# Patient Record
Sex: Female | Born: 1985 | Race: White | Hispanic: No | Marital: Single | Smoking: Never smoker
Health system: Southern US, Community
[De-identification: ages and names within clinical notes are randomized; demographics above are authoritative.]

## PROBLEM LIST (undated history)

## (undated) ENCOUNTER — Emergency Department (HOSPITAL_COMMUNITY): Admission: EM | Payer: Self-pay | Source: Home / Self Care

## (undated) DIAGNOSIS — IMO0002 Reserved for concepts with insufficient information to code with codable children: Secondary | ICD-10-CM

## (undated) DIAGNOSIS — M329 Systemic lupus erythematosus, unspecified: Secondary | ICD-10-CM

---

## 2018-08-26 ENCOUNTER — Ambulatory Visit: Payer: Self-pay | Admitting: *Deleted

## 2018-08-26 NOTE — Telephone Encounter (Signed)
Pt called In c/o fever 102, congestion and shortness of breath.   She is going to the [facility] because that is close to her.   I instructed her to call them first and let them know her symptoms and that she is coming there so they will be prepared for her.   She verbalized understanding of these instructions and agreed to that plan.    Reason for Disposition . MODERATE difficulty breathing (e.g., speaks in phrases, SOB even at rest, pulse 100-120)  Answer Assessment - Initial Assessment Questions 1. COVID-19 DIAGNOSIS: "Who made your Coronavirus (COVID-19) diagnosis?" "Was it confirmed by a positive lab test?" If not diagnosed by a HCP, ask "Are there lots of cases (community spread) where you live?" (See public health department website, if unsure)     No exposures to COVID-19 2. ONSET: "When did the COVID-19 symptoms start?"      I woke up this morning with fever, short of breath.  102 fever. 3. WORST SYMPTOM: "What is your worst symptom?" (e.g., cough, fever, shortness of breath, muscle aches)     Fever 102.    Dry cough.   Muscles aches and chills 4. COUGH: "Do you have a cough?" If so, ask: "How bad is the cough?"       Dry cough 5. FEVER: "Do you have a fever?" If so, ask: "What is your temperature, how was it measured, and when did it start?"     102 6. RESPIRATORY STATUS: "Describe your breathing?" (e.g., shortness of breath, wheezing, unable to speak)      Short of breath.  No asthma.   I usually run every morning.   I can only do 1/2 a breath. 7. BETTER-SAME-WORSE: "Are you getting better, staying the same or getting worse compared to yesterday?"  If getting worse, ask, "In what way?"     Worse 8. HIGH RISK DISEASE: "Do you have any chronic medical problems?" (e.g., asthma, heart or lung disease, weak immune system, etc.)     No asthma or heart problems 9. PREGNANCY: "Is there any chance you are pregnant?" "When was your last menstrual period?"     No 10. OTHER SYMPTOMS:  "Do you have any other symptoms?"  (e.g., chills, fatigue, headache, loss of smell or taste, muscle pain, sore throat)       I have a headache for a week, my taste has changed.  Protocols used: CORONAVIRUS (COVID-19) DIAGNOSED OR SUSPECTED-A-AH

## 2018-09-12 ENCOUNTER — Encounter (HOSPITAL_COMMUNITY): Payer: Self-pay

## 2018-09-12 ENCOUNTER — Emergency Department (HOSPITAL_COMMUNITY): Payer: BLUE CROSS/BLUE SHIELD

## 2018-09-12 ENCOUNTER — Emergency Department (HOSPITAL_COMMUNITY)
Admission: EM | Admit: 2018-09-12 | Discharge: 2018-09-13 | Disposition: A | Discharge status: Home / Self Care | Payer: BLUE CROSS/BLUE SHIELD | Attending: Emergency Medicine | Admitting: Emergency Medicine

## 2018-09-12 DIAGNOSIS — Y9301 Activity, walking, marching and hiking: Secondary | ICD-10-CM | POA: Insufficient documentation

## 2018-09-12 DIAGNOSIS — Y92018 Other place in single-family (private) house as the place of occurrence of the external cause: Secondary | ICD-10-CM | POA: Insufficient documentation

## 2018-09-12 DIAGNOSIS — Y908 Blood alcohol level of 240 mg/100 ml or more: Secondary | ICD-10-CM | POA: Diagnosis not present

## 2018-09-12 DIAGNOSIS — W19XXXA Unspecified fall, initial encounter: Secondary | ICD-10-CM

## 2018-09-12 DIAGNOSIS — F10929 Alcohol use, unspecified with intoxication, unspecified: Secondary | ICD-10-CM | POA: Diagnosis not present

## 2018-09-12 DIAGNOSIS — F10920 Alcohol use, unspecified with intoxication, uncomplicated: Secondary | ICD-10-CM

## 2018-09-12 DIAGNOSIS — Y999 Unspecified external cause status: Secondary | ICD-10-CM | POA: Diagnosis not present

## 2018-09-12 DIAGNOSIS — M791 Myalgia, unspecified site: Secondary | ICD-10-CM | POA: Diagnosis present

## 2018-09-12 DIAGNOSIS — W108XXA Fall (on) (from) other stairs and steps, initial encounter: Secondary | ICD-10-CM | POA: Insufficient documentation

## 2018-09-12 HISTORY — DX: Reserved for concepts with insufficient information to code with codable children: IMO0002

## 2018-09-12 HISTORY — DX: Systemic lupus erythematosus, unspecified: M32.9

## 2018-09-12 LAB — I-STAT CHEM 8, ED
BUN: 9 mg/dL (ref 6–20)
Calcium, Ion: 0.89 mmol/L — CL (ref 1.15–1.40)
Chloride: 105 mmol/L (ref 98–111)
Creatinine, Ser: 1.2 mg/dL — ABNORMAL HIGH (ref 0.44–1.00)
Glucose, Bld: 116 mg/dL — ABNORMAL HIGH (ref 70–99)
HCT: 42 % (ref 36.0–46.0)
Hemoglobin: 14.3 g/dL (ref 12.0–15.0)
Potassium: 4 mmol/L (ref 3.5–5.1)
Sodium: 136 mmol/L (ref 135–145)
TCO2: 23 mmol/L (ref 22–32)

## 2018-09-12 LAB — I-STAT BETA HCG BLOOD, ED (MC, WL, AP ONLY): I-stat hCG, quantitative: <5 m[IU]/mL (ref <5)

## 2018-09-12 LAB — SAMPLE TO BLOOD BANK

## 2018-09-12 LAB — CBC
HCT: 38.3 % (ref 36.0–46.0)
Hemoglobin: 13.2 g/dL (ref 12.0–15.0)
MCH: 36.5 pg — ABNORMAL HIGH (ref 26.0–34.0)
MCHC: 34.5 g/dL (ref 30.0–36.0)
MCV: 105.8 fL — ABNORMAL HIGH (ref 80.0–100.0)
Platelets: 136 10*3/uL — ABNORMAL LOW (ref 150–400)
RBC: 3.62 MIL/uL — ABNORMAL LOW (ref 3.87–5.11)
RDW: 15.3 % (ref 11.5–15.5)
WBC: 7.9 10*3/uL (ref 4.0–10.5)
nRBC: 0 % (ref 0.0–0.2)

## 2018-09-12 LAB — PROTIME-INR
INR: 1 (ref 0.8–1.2)
Prothrombin Time: 13.4 seconds (ref 11.4–15.2)

## 2018-09-12 LAB — LACTIC ACID, PLASMA: Lactic Acid, Venous: 3.3 mmol/L (ref 0.5–1.9)

## 2018-09-12 NOTE — ED Triage Notes (Signed)
Pt from home with ems for fall down 12 stairs, pt had 4 beers and 2 shots of liquor PTA. Pt alert and oriented to self only. Pt c.o pain all over, very anxious. GCS 14 VSS

## 2018-09-13 ENCOUNTER — Encounter (HOSPITAL_COMMUNITY): Payer: Self-pay | Admitting: Emergency Medicine

## 2018-09-13 LAB — URINALYSIS, ROUTINE W REFLEX MICROSCOPIC
Bilirubin Urine: NEGATIVE
Glucose, UA: NEGATIVE mg/dL
Hgb urine dipstick: NEGATIVE
Ketones, ur: NEGATIVE mg/dL
Leukocytes,Ua: NEGATIVE
Nitrite: NEGATIVE
Protein, ur: NEGATIVE mg/dL
Specific Gravity, Urine: 1.003 — ABNORMAL LOW (ref 1.005–1.030)
pH: 6 (ref 5.0–8.0)

## 2018-09-13 LAB — COMPREHENSIVE METABOLIC PANEL
ALT: 152 U/L — ABNORMAL HIGH (ref 0–44)
AST: 395 U/L — ABNORMAL HIGH (ref 15–41)
Albumin: 4.2 g/dL (ref 3.5–5.0)
Alkaline Phosphatase: 175 U/L — ABNORMAL HIGH (ref 38–126)
Anion gap: 18 — ABNORMAL HIGH (ref 5–15)
BUN: 7 mg/dL (ref 6–20)
CO2: 19 mmol/L — ABNORMAL LOW (ref 22–32)
Calcium: 9.4 mg/dL (ref 8.9–10.3)
Chloride: 102 mmol/L (ref 98–111)
Creatinine, Ser: 0.64 mg/dL (ref 0.44–1.00)
GFR calc Af Amer: >60 mL/min (ref >60)
GFR calc non Af Amer: >60 mL/min (ref >60)
Glucose, Bld: 115 mg/dL — ABNORMAL HIGH (ref 70–99)
Potassium: 3.7 mmol/L (ref 3.5–5.1)
Sodium: 139 mmol/L (ref 135–145)
Total Bilirubin: 0.7 mg/dL (ref 0.3–1.2)
Total Protein: 7.2 g/dL (ref 6.5–8.1)

## 2018-09-13 LAB — CDS SEROLOGY

## 2018-09-13 LAB — ETHANOL: Alcohol, Ethyl (B): 474 mg/dL (ref <10)

## 2018-09-13 MED ORDER — IOHEXOL 300 MG/ML  SOLN
100.0000 mL | Freq: Once | INTRAMUSCULAR | Status: AC | PRN
  Administered 2018-09-13: 100 mL via INTRAVENOUS

## 2018-09-13 MED ORDER — SODIUM CHLORIDE 0.9 % IV BOLUS
1000.0000 mL | Freq: Once | INTRAVENOUS | Status: AC
  Administered 2018-09-13: 04:00:00 1000 mL via INTRAVENOUS

## 2018-09-13 MED ORDER — IBUPROFEN 800 MG PO TABS: 800.0000 mg | ORAL_TABLET | Freq: Three times a day (TID) | ORAL | 0 refills | Status: DC

## 2018-09-13 MED ORDER — ACETAMINOPHEN 500 MG PO TABS
1000.0000 mg | ORAL_TABLET | Freq: Once | ORAL | Status: AC
  Administered 2018-09-13: 1000 mg via ORAL
  Filled 2018-09-13: qty 2

## 2018-09-13 MED ORDER — IBUPROFEN 800 MG PO TABS
800.0000 mg | ORAL_TABLET | Freq: Once | ORAL | Status: AC
  Administered 2018-09-13: 07:00:00 800 mg via ORAL
  Filled 2018-09-13: qty 1

## 2018-09-13 NOTE — ED Notes (Signed)
Pt ambulatory to the bathroom with standby assist. No complaints at this time. Pt still confused, thinks she is in [place].

## 2018-09-13 NOTE — ED Notes (Signed)
Dr, Randal Buba aware of current VS, orders for 1 liter NS given.

## 2018-09-13 NOTE — ED Notes (Signed)
Patient  Has pulled all sheets off the stretcher , new sheets applied warm blankets given.

## 2018-09-13 NOTE — ED Provider Notes (Signed)
[facility]   CSN: [id number] Arrival date & time: 09/12/18  2309     History   Chief Complaint Chief Complaint  Patient presents with   Fall    HPI Alison HammingLindsay Morgan Koch is a 33 y.o. female.     The history is provided by the patient and the EMS personnel.  Fall This is a new problem. The current episode started less than 1 hour ago. The problem occurs constantly. The problem has been resolved. Pertinent negatives include no chest pain, no abdominal pain, no headaches and no shortness of breath. Nothing aggravates the symptoms. Nothing relieves the symptoms. She has tried nothing for the symptoms. The treatment provided no relief.    Past Medical History:  Diagnosis Date   Lupus (HCC)     There are no active problems to display for this patient.   History reviewed. No pertinent surgical history.   OB History   No obstetric history on file.      Home Medications    Prior to Admission medications   Not on File    Family History No family history on file.  Social History Social History   Tobacco Use   Smoking status: Not on file  Substance Use Topics   Alcohol use: Not on file   Drug use: Not on file     Allergies   Buspar [buspirone] and Vancomycin   Review of Systems Review of Systems  Constitutional: Negative for fever.  Eyes: Negative for visual disturbance.  Respiratory: Negative for shortness of breath.   Cardiovascular: Negative for chest pain.  Gastrointestinal: Negative for abdominal pain.  Neurological: Negative for headaches.  All other systems reviewed and are negative.    Physical Exam Updated Vital Signs BP 107/73    Pulse 100    Temp (!) 97 F (36.1 C) (Temporal)    Resp 20    Ht 5\' 4"  (1.626 m)    Wt 64.4 kg    SpO2 93%    BMI 24.37 kg/m   Physical Exam Vitals signs and nursing note reviewed.  Constitutional:      General: She is not in acute distress.  Appearance: She is normal weight.  HENT:     Head: Normocephalic and atraumatic.     Right Ear: Tympanic membrane normal.     Left Ear: Tympanic membrane normal.     Nose: Nose normal.     Mouth/Throat:     Mouth: Mucous membranes are moist.     Pharynx: Oropharynx is clear.  Eyes:     Conjunctiva/sclera: Conjunctivae normal.     Pupils: Pupils are equal, round, and reactive to light.  Cardiovascular:     Rate and Rhythm: Normal rate and regular rhythm.     Pulses: Normal pulses.     Heart sounds: Normal heart sounds.  Pulmonary:     Effort: Pulmonary effort is normal.     Breath sounds: Normal breath sounds.  Abdominal:     General: Abdomen is flat. Bowel sounds are normal.     Tenderness: There is no abdominal tenderness. There is no guarding.     Hernia: No hernia is present.  Musculoskeletal: Normal range of motion.  Lymphadenopathy:     Cervical: No cervical adenopathy.  Skin:    General: Skin is warm and dry.     Capillary Refill: Capillary refill takes less than 2 seconds.  Neurological:     General: No focal deficit present.  Mental Status: She is alert.     Deep Tendon Reflexes: Reflexes normal.  Psychiatric:        Mood and Affect: Mood normal.        Behavior: Behavior normal.      ED Treatments / Results  Labs (all labs ordered are listed, but only abnormal results are displayed) Labs Reviewed  COMPREHENSIVE METABOLIC PANEL - Abnormal; Notable for the following components:      Result Value   CO2 19 (*)    Glucose, Bld 115 (*)    AST 395 (*)    ALT 152 (*)    Alkaline Phosphatase 175 (*)    Anion gap 18 (*)    All other components within normal limits  CBC - Abnormal; Notable for the following components:   RBC 3.62 (*)    MCV 105.8 (*)    MCH 36.5 (*)    Platelets 136 (*)    All other components within normal limits  ETHANOL - Abnormal; Notable for the following components:   Alcohol, Ethyl (B) 474 (*)    All other components within normal  limits  LACTIC ACID, PLASMA - Abnormal; Notable for the following components:   Lactic Acid, Venous 3.3 (*)    All other components within normal limits  I-STAT CHEM 8, ED - Abnormal; Notable for the following components:   Creatinine, Ser 1.20 (*)    Glucose, Bld 116 (*)    Calcium, Ion 0.89 (*)    All other components within normal limits  CDS SEROLOGY  PROTIME-INR  URINALYSIS, ROUTINE W REFLEX MICROSCOPIC  I-STAT BETA HCG BLOOD, ED ([facility], [facility], [facility] ONLY)  SAMPLE TO BLOOD BANK    EKG EKG Interpretation  Date/Time:  Thursday September 12 2018 23:15:40 EDT Ventricular Rate:  120 PR Interval:    QRS Duration: 95 QT Interval:  303 QTC Calculation: 429 R Axis:   72 Text Interpretation:  Sinus tachycardia Nonspecific T abnormalities, inferior leads No previous ECGs available Confirmed by Glynn Octave [phone]) on 09/12/2018 11:29:58 PM   Radiology Ct Head Wo Contrast  Result Date: 09/13/2018 CLINICAL DATA:  Initial evaluation for acute trauma, fall. EXAM: CT HEAD WITHOUT CONTRAST CT CERVICAL SPINE WITHOUT CONTRAST TECHNIQUE: Multidetector CT imaging of the head and cervical spine was performed following the standard protocol without intravenous contrast. Multiplanar CT image reconstructions of the cervical spine were also generated. COMPARISON:  None. FINDINGS: CT HEAD FINDINGS Brain: Advanced cerebral and cerebellar atrophy for age. No evidence for acute intracranial hemorrhage. No findings to suggest acute large vessel territory infarct. No mass lesion, midline shift, or mass effect. Ventricles are normal in size without evidence for hydrocephalus. No extra-axial fluid collection identified. Vascular: No hyperdense vessel identified. Skull: Scalp soft tissues demonstrate no acute abnormality. Calvarium intact. Sinuses/Orbits: Globes and orbital soft tissues within normal limits. Mild scattered mucosal thickening within the ethmoidal air cells and frontal sinuses. Paranasal sinuses are otherwise  largely clear. No mastoid effusion. CT CERVICAL SPINE FINDINGS Alignment: Straightening of the normal cervical lordosis without listhesis or malalignment. Skull base and vertebrae: Skull base intact. Normal C1-2 articulations are preserved in the dens is intact. Vertebral body heights maintained. No acute fracture. Soft tissues and spinal canal: No acute soft tissue abnormality within the neck. No abnormal prevertebral edema. Spinal canal within normal limits. Disc levels: Small posterior disc osteophyte at C5-6 without significant stenosis. Anterior endplate spurring noted at T1-2. Upper chest: Visualized upper chest demonstrates no acute finding. Visualized lungs are clear. Other:  None. IMPRESSION: CT BRAIN: 1. No acute intracranial abnormality. 2. Mildly advanced cerebral and cerebellar atrophy for age. CT CERVICAL SPINE: No acute traumatic injury within the cervical spine. Electronically Signed   By: Jeannine Boga M.D.   On: 09/13/2018 00:44   Ct Chest W Contrast  Result Date: 09/13/2018 CLINICAL DATA:  Initial evaluation for acute trauma, fall. EXAM: CT CHEST, ABDOMEN, AND PELVIS WITH CONTRAST TECHNIQUE: Multidetector CT imaging of the chest, abdomen and pelvis was performed following the standard protocol during bolus administration of intravenous contrast. CONTRAST:  142mL OMNIPAQUE IOHEXOL 300 MG/ML  SOLN COMPARISON:  Prior radiograph from earlier same day. FINDINGS: CT CHEST FINDINGS Cardiovascular: Intrathoracic aorta normal in caliber without aneurysm or other acute abnormality. Visualized great vessels intact and within normal limits. Heart size normal. No pericardial effusion. Limited assessment of the pulmonary arterial tree grossly unremarkable. Mediastinum/Nodes: Thyroid within normal limits. No mediastinal hematoma or mass. No enlarged mediastinal, hilar, or axillary lymph nodes. No pneumomediastinum. Circumferential wall thickening about the mid-distal esophagus (series 3, image 46).  Esophagus otherwise unremarkable. Lungs/Pleura: Tracheobronchial tree intact and patent. Lungs well inflated bilaterally. No focal infiltrates, edema, or effusion. No pneumothorax. No worrisome pulmonary nodule or mass. Musculoskeletal: External soft tissues demonstrate no acute finding. No acute fracture or other osseous abnormality. No discrete lytic or blastic osseous lesions. CT ABDOMEN PELVIS FINDINGS Hepatobiliary: Liver is enlarged with diffuse nodularity of the hepatic contour, compatible with cirrhosis. Scattered areas of AV shunting noted. No discrete intrahepatic lesions. Gallbladder within normal limits. No biliary dilatation. Pancreas: Pancreas is atrophic but otherwise unremarkable. Spleen: Spleen intact and normal. Adrenals/Urinary Tract: Adrenal glands within normal limits. Kidneys equal size with symmetric enhancement. No nephrolithiasis, hydronephrosis or focal enhancing renal mass. No visible hydroureter. Bladder within normal limits. Stomach/Bowel: Stomach within normal limits. No evidence for bowel obstruction or acute bowel injury. No acute inflammatory changes seen about the bowels. Vascular/Lymphatic: Normal intravascular enhancement seen throughout the intra-abdominal aorta and its branch vessels. No adenopathy. Reproductive: Uterus and ovaries within normal limits. Other: No free air or fluid. No ascites. No mesenteric or retroperitoneal hematoma. Musculoskeletal: Few small areas of mild hazy soft tissue stranding seen within the subcutaneous fat of the bilateral gluteal regions (series 3, image 122 on the right, image 127 on the left), nonspecific, but could reflect mild contusion. External soft tissues otherwise unremarkable. No acute fracture or other osseous abnormality. 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 No discrete lytic or blastic osseous lesions. IMPRESSION: 1. Mild of  the bilateral gluteal regions as above, nonspecific, but could reflect mild contusion. Correlation with physical exam recommended. Stranding within the subcutaneous fat 2. No other CT evidence for acute traumatic injury within the chest, abdomen, and pelvis. 3. Circumferential wall thickening about the mid-distal esophagus. Finding is nonspecific, but could reflect sequelae of acute esophagitis, reflux disease, or vomiting. 4. Hepatic cirrhosis.  No ascites. 5. 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 Electronically Signed   By: Jeannine Boga M.D.   On: 09/13/2018 01:15   Ct Cervical Spine Wo Contrast  Result Date: 09/13/2018 CLINICAL DATA:  Initial evaluation for acute trauma, fall. EXAM: CT HEAD WITHOUT CONTRAST CT CERVICAL SPINE WITHOUT CONTRAST TECHNIQUE: Multidetector CT imaging of the head and cervical spine was performed following the standard protocol without intravenous contrast. Multiplanar CT image reconstructions of the cervical spine were also generated. COMPARISON:  None. FINDINGS: CT HEAD FINDINGS  Brain: Advanced cerebral and cerebellar atrophy for age. No evidence for acute intracranial hemorrhage. No findings to suggest acute large vessel territory infarct. No mass lesion, midline shift, or mass effect. Ventricles are normal in size without evidence for hydrocephalus. No extra-axial fluid collection identified. Vascular: No hyperdense vessel identified. Skull: Scalp soft tissues demonstrate no acute abnormality. Calvarium intact. Sinuses/Orbits: Globes and orbital soft tissues within normal limits. Mild scattered mucosal thickening within the ethmoidal air cells and frontal sinuses. Paranasal sinuses are otherwise largely clear. No mastoid effusion. CT CERVICAL SPINE FINDINGS Alignment: Straightening of the normal cervical lordosis without listhesis or malalignment. Skull base and vertebrae: Skull base intact. Normal C1-2 articulations are preserved in  the dens is intact. Vertebral body heights maintained. No acute fracture. Soft tissues and spinal canal: No acute soft tissue abnormality within the neck. No abnormal prevertebral edema. Spinal canal within normal limits. Disc levels: Small posterior disc osteophyte at C5-6 without significant stenosis. Anterior endplate spurring noted at T1-2. Upper chest: Visualized upper chest demonstrates no acute finding. Visualized lungs are clear. Other: None. IMPRESSION: CT BRAIN: 1. No acute intracranial abnormality. 2. Mildly advanced cerebral and cerebellar atrophy for age. CT CERVICAL SPINE: No acute traumatic injury within the cervical spine. Electronically Signed   By: Rise MuBenjamin  McClintock M.D.   On: 09/13/2018 00:44   Ct Abdomen Pelvis W Contrast  Result Date: 09/13/2018 CLINICAL DATA:  Initial evaluation for acute trauma, fall. EXAM: CT CHEST, ABDOMEN, AND PELVIS WITH CONTRAST TECHNIQUE: Multidetector CT imaging of the chest, abdomen and pelvis was performed following the standard protocol during bolus administration of intravenous contrast. CONTRAST:  100mL OMNIPAQUE IOHEXOL 300 MG/ML  SOLN COMPARISON:  Prior radiograph from earlier same day. FINDINGS: CT CHEST FINDINGS Cardiovascular: Intrathoracic aorta normal in caliber without aneurysm or other acute abnormality. Visualized great vessels intact and within normal limits. Heart size normal. No pericardial effusion. Limited assessment of the pulmonary arterial tree grossly unremarkable. Mediastinum/Nodes: Thyroid within normal limits. No mediastinal hematoma or mass. No enlarged mediastinal, hilar, or axillary lymph nodes. No pneumomediastinum. Circumferential wall thickening about the mid-distal esophagus (series 3, image 46). Esophagus otherwise unremarkable. Lungs/Pleura: Tracheobronchial tree intact and patent. Lungs well inflated bilaterally. No focal infiltrates, edema, or effusion. No pneumothorax. No worrisome pulmonary nodule or mass. Musculoskeletal:  External soft tissues demonstrate no acute finding. No acute fracture or other osseous abnormality. No discrete lytic or blastic osseous lesions. CT ABDOMEN PELVIS FINDINGS Hepatobiliary: Liver is enlarged with diffuse nodularity of the hepatic contour, compatible with cirrhosis. Scattered areas of AV shunting noted. No discrete intrahepatic lesions. Gallbladder within normal limits. No biliary dilatation. Pancreas: Pancreas is atrophic but otherwise unremarkable. Spleen: Spleen intact and normal. Adrenals/Urinary Tract: Adrenal glands within normal limits. Kidneys equal size with symmetric enhancement. No nephrolithiasis, hydronephrosis or focal enhancing renal mass. No visible hydroureter. Bladder within normal limits. Stomach/Bowel: Stomach within normal limits. No evidence for bowel obstruction or acute bowel injury. No acute inflammatory changes seen about the bowels. Vascular/Lymphatic: Normal intravascular enhancement seen throughout the intra-abdominal aorta and its branch vessels. No adenopathy. Reproductive: Uterus and ovaries within normal limits. Other: No free air or fluid. No ascites. No mesenteric or retroperitoneal hematoma. Musculoskeletal: Few small areas of mild hazy soft tissue stranding seen within the subcutaneous fat of the bilateral gluteal regions (series 3, image 122 on the right, image 127 on the left), nonspecific, but could reflect mild contusion. External soft tissues otherwise unremarkable. No acute fracture or other osseous abnormality. Chronic bilateral pars defects at  L5 with associated trace spondylolisthesis. 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 No discrete lytic or blastic osseous lesions. IMPRESSION: 1. Mild of the bilateral gluteal regions as above, nonspecific, but could reflect mild contusion. Correlation with physical exam recommended. Stranding within the subcutaneous fat 2. No other CT evidence for acute traumatic injury within the chest,  abdomen, and pelvis. 3. Circumferential wall thickening about the mid-distal esophagus. Finding is nonspecific, but could reflect sequelae of acute esophagitis, reflux disease, or vomiting. 4. Hepatic cirrhosis.  No ascites. 5. 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 Electronically Signed   By: Rise MuBenjamin  McClintock M.D.   On: 09/13/2018 01:15   Dg Pelvis Portable  Result Date: 09/12/2018 CLINICAL DATA:  Fall down 12 stairs. Diffuse pain. EXAM: PORTABLE PELVIS 1-2 VIEWS COMPARISON:  None. FINDINGS: The cortical margins of the bony pelvis are intact. No fracture. Pubic symphysis and sacroiliac joints are congruent. Both femoral heads are well-seated in the respective acetabula. IMPRESSION: No pelvic fracture. Electronically Signed   By: Narda RutherfordMelanie  Sanford M.D.   On: 09/12/2018 23:52   Dg Chest Port 1 View  Result Date: 09/12/2018 CLINICAL DATA:  Fall down 12 stairs. Diffuse pain. EXAM: PORTABLE CHEST 1 VIEW COMPARISON:  None. FINDINGS: Low lung volumes.The cardiomediastinal contours are normal. The lungs are clear. Pulmonary vasculature is normal. No consolidation, pleural effusion, or pneumothorax. No acute osseous abnormalities are seen. IMPRESSION: Low lung volumes without evidence of acute traumatic injury. Electronically Signed   By: Narda RutherfordMelanie  Sanford M.D.   On: 09/12/2018 23:42    Procedures Procedures (including critical care time)  Medications Ordered in ED Medications  iohexol (OMNIPAQUE) 300 MG/ML solution 100 mL (100 mLs Intravenous Contrast Given 09/13/18 0003)  sodium chloride 0.9 % bolus 1,000 mL (0 mLs Intravenous Stopped 09/13/18 0451)     Final Clinical Impressions(s) / ED Diagnoses   Final diagnoses:  Fall, initial encounter  Alcoholic intoxication without complication (HCC)     Return for intractable cough, coughing up blood,fevers >100.4 unrelieved by medication, shortness of breath, intractable vomiting, chest pain, shortness of breath,  weakness,numbness, changes in speech, facial asymmetry,abdominal pain, passing out,Inability to tolerate liquids or food, cough, altered mental status or any concerns. No signs of systemic illness or infection. The patient is nontoxic-appearing on exam and vital signs are within normal limits.   I have reviewed the triage vital signs and the nursing notes. Pertinent labs &imaging results that were available during my care of the patient were reviewed by me and considered in my medical decision making (see chart for details).  After history, exam, and medical workup I feel the patient has been appropriately medically screened and is safe for discharge home. Pertinent diagnoses were discussed with the patient. Patient was given return precautions     Justyna Timoney, MD 09/13/18 [id number]

## 2019-01-19 ENCOUNTER — Encounter (HOSPITAL_COMMUNITY): Payer: Self-pay

## 2019-01-19 ENCOUNTER — Emergency Department (HOSPITAL_COMMUNITY)
Admission: EM | Admit: 2019-01-19 | Discharge: 2019-01-20 | Disposition: A | Discharge status: Other Acute Inpatient Hospital | Payer: BLUE CROSS/BLUE SHIELD | Source: Home / Self Care | Attending: Emergency Medicine | Admitting: Emergency Medicine

## 2019-01-19 ENCOUNTER — Other Ambulatory Visit: Payer: Self-pay

## 2019-01-19 ENCOUNTER — Ambulatory Visit (HOSPITAL_COMMUNITY)
Admission: RE | Admit: 2019-01-19 | Discharge: 2019-01-19 | Disposition: A | Discharge status: Home / Self Care | Payer: BLUE CROSS/BLUE SHIELD | Source: Home / Self Care | Attending: Psychiatry | Admitting: Psychiatry

## 2019-01-19 DIAGNOSIS — Z20828 Contact with and (suspected) exposure to other viral communicable diseases: Secondary | ICD-10-CM | POA: Insufficient documentation

## 2019-01-19 DIAGNOSIS — F323 Major depressive disorder, single episode, severe with psychotic features: Secondary | ICD-10-CM | POA: Insufficient documentation

## 2019-01-19 DIAGNOSIS — F12929 Cannabis use, unspecified with intoxication, unspecified: Secondary | ICD-10-CM | POA: Insufficient documentation

## 2019-01-19 DIAGNOSIS — R45851 Suicidal ideations: Secondary | ICD-10-CM

## 2019-01-19 DIAGNOSIS — F333 Major depressive disorder, recurrent, severe with psychotic symptoms: Secondary | ICD-10-CM | POA: Insufficient documentation

## 2019-01-19 DIAGNOSIS — Z881 Allergy status to other antibiotic agents status: Secondary | ICD-10-CM | POA: Insufficient documentation

## 2019-01-19 DIAGNOSIS — Z79899 Other long term (current) drug therapy: Secondary | ICD-10-CM | POA: Insufficient documentation

## 2019-01-19 DIAGNOSIS — F411 Generalized anxiety disorder: Secondary | ICD-10-CM | POA: Insufficient documentation

## 2019-01-19 DIAGNOSIS — Z888 Allergy status to other drugs, medicaments and biological substances status: Secondary | ICD-10-CM | POA: Insufficient documentation

## 2019-01-19 LAB — CBC
HCT: 39.1 % (ref 36.0–46.0)
Hemoglobin: 12.6 g/dL (ref 12.0–15.0)
MCH: 31.7 pg (ref 26.0–34.0)
MCHC: 32.2 g/dL (ref 30.0–36.0)
MCV: 98.5 fL (ref 80.0–100.0)
Platelets: 224 10*3/uL (ref 150–400)
RBC: 3.97 MIL/uL (ref 3.87–5.11)
RDW: 14.1 % (ref 11.5–15.5)
WBC: 6.6 10*3/uL (ref 4.0–10.5)
nRBC: 0 % (ref 0.0–0.2)

## 2019-01-19 LAB — I-STAT BETA HCG BLOOD, ED (MC, WL, AP ONLY): I-stat hCG, quantitative: <5 m[IU]/mL (ref <5)
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
## 2019-01-19 NOTE — ED Triage Notes (Signed)
Patient brought in by [facility] transport. States that she has been struggling with SI for a while but tonight was in an argument with her mother and it made her feelings stronger. States she does have a plan but is not specific. Denies doing anything to harm herself.

## 2019-01-20 ENCOUNTER — Encounter (HOSPITAL_COMMUNITY): Payer: Self-pay | Admitting: Emergency Medicine

## 2019-01-20 ENCOUNTER — Inpatient Hospital Stay (HOSPITAL_COMMUNITY)
Admission: AD | Admit: 2019-01-20 | Discharge: 2019-02-04 | DRG: 897 | Disposition: A | Discharge status: Home / Self Care | Payer: BLUE CROSS/BLUE SHIELD | Source: Intra-hospital | Attending: Psychiatry | Admitting: Psychiatry

## 2019-01-20 DIAGNOSIS — I951 Orthostatic hypotension: Secondary | ICD-10-CM | POA: Diagnosis present

## 2019-01-20 DIAGNOSIS — F1411 Cocaine abuse, in remission: Secondary | ICD-10-CM | POA: Diagnosis present

## 2019-01-20 DIAGNOSIS — M329 Systemic lupus erythematosus, unspecified: Secondary | ICD-10-CM | POA: Diagnosis present

## 2019-01-20 DIAGNOSIS — F1220 Cannabis dependence, uncomplicated: Secondary | ICD-10-CM | POA: Diagnosis present

## 2019-01-20 DIAGNOSIS — R45851 Suicidal ideations: Secondary | ICD-10-CM | POA: Diagnosis present

## 2019-01-20 DIAGNOSIS — F333 Major depressive disorder, recurrent, severe with psychotic symptoms: Secondary | ICD-10-CM | POA: Diagnosis present

## 2019-01-20 DIAGNOSIS — Z20828 Contact with and (suspected) exposure to other viral communicable diseases: Secondary | ICD-10-CM | POA: Diagnosis present

## 2019-01-20 DIAGNOSIS — G4700 Insomnia, unspecified: Secondary | ICD-10-CM | POA: Diagnosis present

## 2019-01-20 DIAGNOSIS — F1994 Other psychoactive substance use, unspecified with psychoactive substance-induced mood disorder: Secondary | ICD-10-CM | POA: Diagnosis not present

## 2019-01-20 DIAGNOSIS — F1024 Alcohol dependence with alcohol-induced mood disorder: Secondary | ICD-10-CM | POA: Diagnosis not present

## 2019-01-20 DIAGNOSIS — Z79899 Other long term (current) drug therapy: Secondary | ICD-10-CM | POA: Diagnosis not present

## 2019-01-20 DIAGNOSIS — Y908 Blood alcohol level of 240 mg/100 ml or more: Secondary | ICD-10-CM | POA: Diagnosis present

## 2019-01-20 DIAGNOSIS — Z23 Encounter for immunization: Secondary | ICD-10-CM | POA: Diagnosis not present

## 2019-01-20 LAB — COMPREHENSIVE METABOLIC PANEL
ALT: 22 U/L (ref 0–44)
AST: 35 U/L (ref 15–41)
Albumin: 4.2 g/dL (ref 3.5–5.0)
Alkaline Phosphatase: 83 U/L (ref 38–126)
Anion gap: 11 (ref 5–15)
BUN: 17 mg/dL (ref 6–20)
CO2: 25 mmol/L (ref 22–32)
Calcium: 8.7 mg/dL — ABNORMAL LOW (ref 8.9–10.3)
Chloride: 110 mmol/L (ref 98–111)
Creatinine, Ser: 0.93 mg/dL (ref 0.44–1.00)
GFR calc Af Amer: >60 mL/min (ref >60)
GFR calc non Af Amer: >60 mL/min (ref >60)
Glucose, Bld: 124 mg/dL — ABNORMAL HIGH (ref 70–99)
Potassium: 3.8 mmol/L (ref 3.5–5.1)
Sodium: 146 mmol/L — ABNORMAL HIGH (ref 135–145)
Total Bilirubin: 0.3 mg/dL (ref 0.3–1.2)
Total Protein: 7.6 g/dL (ref 6.5–8.1)

## 2019-01-20 LAB — RAPID URINE DRUG SCREEN, HOSP PERFORMED
Amphetamines: NOT DETECTED
Barbiturates: NOT DETECTED
Benzodiazepines: NOT DETECTED
Cocaine: NOT DETECTED
Opiates: NOT DETECTED
Tetrahydrocannabinol: POSITIVE — AB

## 2019-01-20 LAB — SARS CORONAVIRUS 2 (TAT 6-24 HRS): SARS Coronavirus 2: NEGATIVE

## 2019-01-20 LAB — SALICYLATE LEVEL: Salicylate Lvl: <7 mg/dL (ref 2.8–30.0)

## 2019-01-20 LAB — ACETAMINOPHEN LEVEL: Acetaminophen (Tylenol), Serum: <10 ug/mL — ABNORMAL LOW (ref 10–30)

## 2019-01-20 LAB — ETHANOL: Alcohol, Ethyl (B): 442 mg/dL (ref <10)

## 2019-01-20 MED ORDER — LORAZEPAM 1 MG PO TABS
1.0000 mg | ORAL_TABLET | Freq: Four times a day (QID) | ORAL | Status: AC
  Administered 2019-01-20 (×3): 1 mg via ORAL
  Filled 2019-01-20 (×3): qty 1

## 2019-01-20 MED ORDER — QUETIAPINE FUMARATE 50 MG PO TABS
50.0000 mg | ORAL_TABLET | Freq: Every day | ORAL | Status: DC
  Administered 2019-01-20: 50 mg via ORAL
  Filled 2019-01-20 (×3): qty 1

## 2019-01-20 MED ORDER — ACETAMINOPHEN 325 MG PO TABS: 650.0000 mg | ORAL_TABLET | Freq: Four times a day (QID) | ORAL | Status: DC | PRN

## 2019-01-20 MED ORDER — ALUM & MAG HYDROXIDE-SIMETH 200-200-20 MG/5ML PO SUSP
30.0000 mL | ORAL | Status: DC | PRN
  Administered 2019-01-28: 30 mL via ORAL
  Filled 2019-01-20: qty 30

## 2019-01-20 MED ORDER — LORAZEPAM 1 MG PO TABS: 1.0000 mg | ORAL_TABLET | Freq: Every day | ORAL | Status: DC

## 2019-01-20 MED ORDER — LORAZEPAM 1 MG PO TABS
1.0000 mg | ORAL_TABLET | Freq: Three times a day (TID) | ORAL | Status: DC
  Administered 2019-01-21: 1 mg via ORAL
  Filled 2019-01-20: qty 1

## 2019-01-20 MED ORDER — VITAMIN B-1 100 MG PO TABS
100.0000 mg | ORAL_TABLET | Freq: Every day | ORAL | Status: DC
  Administered 2019-01-21 – 2019-02-03 (×14): 100 mg via ORAL
  Filled 2019-01-20 (×17): qty 1

## 2019-01-20 MED ORDER — LOPERAMIDE HCL 2 MG PO CAPS: 2.0000 mg | ORAL_CAPSULE | ORAL | Status: DC | PRN

## 2019-01-20 MED ORDER — VENLAFAXINE HCL ER 75 MG PO CP24
75.0000 mg | ORAL_CAPSULE | Freq: Every day | ORAL | Status: DC
  Administered 2019-01-21 – 2019-01-24 (×4): 75 mg via ORAL
  Filled 2019-01-20 (×6): qty 1

## 2019-01-20 MED ORDER — LORAZEPAM 1 MG PO TABS: 1.0000 mg | ORAL_TABLET | Freq: Two times a day (BID) | ORAL | Status: DC

## 2019-01-20 MED ORDER — ONDANSETRON 4 MG PO TBDP
4.0000 mg | ORAL_TABLET | Freq: Four times a day (QID) | ORAL | Status: AC | PRN
  Administered 2019-01-21: 4 mg via ORAL
  Filled 2019-01-20: qty 1

## 2019-01-20 MED ORDER — QUETIAPINE FUMARATE 100 MG PO TABS
100.0000 mg | ORAL_TABLET | Freq: Every day | ORAL | Status: DC
  Filled 2019-01-20 (×2): qty 1

## 2019-01-20 MED ORDER — HYDROXYZINE HCL 25 MG PO TABS
25.0000 mg | ORAL_TABLET | Freq: Three times a day (TID) | ORAL | Status: DC | PRN
  Administered 2019-01-22 – 2019-01-24 (×6): 25 mg via ORAL
  Filled 2019-01-20 (×6): qty 1

## 2019-01-20 MED ORDER — LORAZEPAM 1 MG PO TABS: 1.0000 mg | ORAL_TABLET | Freq: Four times a day (QID) | ORAL | Status: DC | PRN

## 2019-01-20 MED ORDER — MAGNESIUM HYDROXIDE 400 MG/5ML PO SUSP
30.0000 mL | Freq: Every day | ORAL | Status: DC | PRN
  Administered 2019-01-24 – 2019-01-29 (×3): 30 mL via ORAL
  Filled 2019-01-20 (×3): qty 30

## 2019-01-20 MED ORDER — GABAPENTIN 300 MG PO CAPS
300.0000 mg | ORAL_CAPSULE | Freq: Three times a day (TID) | ORAL | Status: DC
  Administered 2019-01-20 – 2019-01-21 (×2): 300 mg via ORAL
  Filled 2019-01-20 (×6): qty 1

## 2019-01-20 MED ORDER — ADULT MULTIVITAMIN W/MINERALS CH
1.0000 | ORAL_TABLET | Freq: Every day | ORAL | Status: DC
  Administered 2019-01-20 – 2019-02-03 (×15): 1 via ORAL
  Filled 2019-01-20 (×18): qty 1

## 2019-01-20 MED ORDER — PNEUMOCOCCAL VAC POLYVALENT 25 MCG/0.5ML IJ INJ
0.5000 mL | INJECTION | INTRAMUSCULAR | Status: AC
  Administered 2019-01-21: 12:00:00 0.5 mL via INTRAMUSCULAR

## 2019-01-20 NOTE — Tx Team (Addendum)
Initial Treatment Plan 01/20/2019 6:16 PM Alison Koch EFE:[id number]    PATIENT STRESSORS: Marital or family conflict Substance abuse   PATIENT STRENGTHS: Capable of independent living Special hobby/interest Work skills   PATIENT IDENTIFIED PROBLEMS: "Agitation"  "Anger"  "Depression"  "Panic attacks"  Substance Abuse             DISCHARGE CRITERIA:  Improved stabilization in mood, thinking, and/or behavior Reduction of life-threatening or endangering symptoms to within safe limits Verbal commitment to aftercare and medication compliance Withdrawal symptoms are absent or subacute and managed without 24-hour nursing intervention  PRELIMINARY DISCHARGE PLAN: Outpatient therapy Return to previous living arrangement Return to previous work or school arrangements  PATIENT/FAMILY INVOLVEMENT: This treatment plan has been presented to and reviewed with the patient, Alison Koch, and/or family member.  The patient and family have been given the opportunity to ask questions and make suggestions.  Harriet Masson, RN 01/20/2019, 6:16 PM

## 2019-01-20 NOTE — ED Notes (Signed)
Attempted to call report.  Receiving nurse will call back for report.
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
## 2019-01-20 NOTE — ED Provider Notes (Signed)
Took patient care handoff report from Quincy Carnes, PA-C. Plan: Awaiting COVID-19 test.  Has a room assignment at [facility].  Otherwise medically cleared.  Labs Reviewed  COMPREHENSIVE METABOLIC PANEL - Abnormal; Notable for the following components:      Result Value   Sodium 146 (*)    Glucose, Bld 124 (*)    Calcium 8.7 (*)    All other components within normal limits  ETHANOL - Abnormal; Notable for the following components:   Alcohol, Ethyl (B) 442 (*)    All other components within normal limits  ACETAMINOPHEN LEVEL - Abnormal; Notable for the following components:   Acetaminophen (Tylenol), Serum <10 (*)    All other components within normal limits  RAPID URINE DRUG SCREEN, HOSP PERFORMED - Abnormal; Notable for the following components:   Tetrahydrocannabinol POSITIVE (*)    All other components within normal limits  SARS CORONAVIRUS 2 (TAT 3-71 HRS)  SALICYLATE LEVEL  CBC  I-STAT BETA HCG BLOOD, ED ([facility], [facility], [facility] ONLY)    COVID-19 test negative.  Steps initiated for patient transfer back to [facility].   Lorayne Bender, PA-C 01/20/19 1215    Tegeler, Gwenyth Allegra, MD 01/20/19 [phone]

## 2019-01-20 NOTE — ED Notes (Signed)
Verified with [place], pt has room 302-2, report to be called to adult unit

## 2019-01-20 NOTE — ED Notes (Signed)
[facility] called for transport.
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
## 2019-01-20 NOTE — BHH Group Notes (Signed)
LCSW Group Therapy Notes 01/20/2019 1:46 PM  Type of Therapy and Topic: Group Therapy: Overcoming Obstacles  Participation Level: Did Not Attend  Description of Group:  In this group patients will be encouraged to explore what they see as obstacles to their own wellness and recovery. They will be guided to discuss their thoughts, feelings, and behaviors related to these obstacles. The group will process together ways to cope with barriers, with attention given to specific choices patients can make. Each patient will be challenged to identify changes they are motivated to make in order to overcome their obstacles. This group will be process-oriented, with patients participating in exploration of their own experiences as well as giving and receiving support and challenge from other group members.  Therapeutic Goals: 1. Patient will identify personal and current obstacles as they relate to admission. 2. Patient will identify barriers that currently interfere with their wellness or overcoming obstacles.  3. Patient will identify feelings, thought process and behaviors related to these barriers. 4. Patient will identify two changes they are willing to make to overcome these obstacles:   Summary of Patient Progress Invited and heard overhead announcement, chose not to attend.   Therapeutic Modalities:  Cognitive Behavioral Therapy Solution Focused Therapy Motivational Interviewing Relapse Prevention Therapy  Tyshea Imel, MSW, LCSWA 01/20/2019 1:46 PM   

## 2019-01-20 NOTE — ED Provider Notes (Signed)
[facility]-EMERGENCY DEPT Provider Note   CSN: [id number] Arrival date & time: 01/19/19  2229     History   Chief Complaint Chief Complaint  Patient presents with  . Medical Clearance    HPI Alison Koch is a 33 y.o. female.     The history is provided by the patient and medical records.    33 year old female with history of lupus, presenting to the ED with worsening SI.  Reportedly, she has had issues with this for quite some time, worse after getting into a verbal altercation with her mother and mother's boyfriend.  She does report plan, but does not verbalize what this is to staff.  She has not attempted to harm herself.  Denies any heavy alcohol use, reports occasional marijuana  She denies any homicidal ideation.  No hallucinations.  She is established with psychiatry, Dr. Delena BaliAkaronu.  She had an appt on 01/17/19, effexor was increased to 75mg  daily and plan to possibly increased seoquel to 150mg  nightly if no BP issues.  Past Medical History:  Diagnosis Date  . Lupus (HCC)     There are no active problems to display for this patient.   History reviewed. No pertinent surgical history.   OB History   No obstetric history on file.      Home Medications    Prior to Admission medications   Medication Sig Start Date End Date Taking? Authorizing Provider  albuterol (VENTOLIN HFA) 108 (90 Base) MCG/ACT inhaler Inhale 2 puffs into the lungs every 6 (six) hours as needed for wheezing or shortness of breath.  08/26/18  Yes [provider]  gabapentin (NEURONTIN) 400 MG capsule Take 400 mg by mouth 3 (three) times daily. 01/19/19  Yes [provider]  hydrOXYzine (ATARAX/VISTARIL) 50 MG tablet Take 25 mg by mouth 3 (three) times daily as needed for anxiety, itching or nausea.  01/17/19  Yes [provider]  ibuprofen (ADVIL) 800 MG tablet Take 1 tablet (800 mg total) by mouth 3 (three) times daily. Patient taking differently:  Take 800 mg by mouth every 8 (eight) hours as needed for fever, headache, mild pain, moderate pain or cramping.  09/13/18  Yes Palumbo, April, MD  meclizine (ANTIVERT) 25 MG tablet Take 25 mg by mouth 3 (three) times daily as needed for dizziness.  12/05/18  Yes [provider]  Melatonin (MELATONIN MAXIMUM STRENGTH) 5 MG TABS Take 5 mg by mouth at bedtime.   Yes [provider]  Multiple Vitamin (MULTI-VITAMIN) tablet Take 1 tablet by mouth daily.   Yes [provider]  progesterone (PROMETRIUM) 200 MG capsule Take 200 mg by mouth See admin instructions. Only takes for 10 days out of each month, days vary 12/24/18  Yes [provider]  QUEtiapine (SEROQUEL) 100 MG tablet Take 100-150 mg by mouth at bedtime as needed (sleep).  01/17/19  Yes [provider]  thiamine (VITAMIN B-1) 100 MG tablet Take 100 mg by mouth daily.   Yes [provider]  traZODone (DESYREL) 50 MG tablet Take 50 mg by mouth at bedtime as needed. 10/30/18  Yes [provider]  venlafaxine XR (EFFEXOR-XR) 75 MG 24 hr capsule Take 75 mg by mouth daily. 01/17/19  Yes [provider]    Family History No family history on file.  Social History Social History   Tobacco Use  . Smoking status: Not on file  Substance Use Topics  . Alcohol use: Not on file  . Drug use: Not  on file     Allergies   Codeine, Buspar [buspirone], and Vancomycin   Review of Systems Review of Systems  Psychiatric/Behavioral: Positive for suicidal ideas.  All other systems reviewed and are negative.    Physical Exam Updated Vital Signs BP 114/80 (BP Location: Left Arm)   Pulse 95   Temp 98.6 F (37 C) (Oral)   Resp 17   SpO2 100%   Physical Exam Vitals signs and nursing note reviewed.  Constitutional:      Appearance: She is well-developed.     Comments: Talking on phone with friend, NAD  HENT:     Head: Normocephalic and atraumatic.  Eyes:      Conjunctiva/sclera: Conjunctivae normal.     Pupils: Pupils are equal, round, and reactive to light.  Neck:     Musculoskeletal: Normal range of motion.  Cardiovascular:     Rate and Rhythm: Normal rate and regular rhythm.     Heart sounds: Normal heart sounds.  Pulmonary:     Effort: Pulmonary effort is normal.     Breath sounds: Normal breath sounds.  Abdominal:     General: Bowel sounds are normal.     Palpations: Abdomen is soft.     Tenderness: There is no abdominal tenderness.     Hernia: No hernia is present.  Musculoskeletal: Normal range of motion.  Skin:    General: Skin is warm and dry.  Neurological:     Mental Status: She is alert and oriented to person, place, and time.  Psychiatric:        Thought Content: Thought content includes suicidal ideation.      ED Treatments / Results  Labs (all labs ordered are listed, but only abnormal results are displayed) Labs Reviewed  COMPREHENSIVE METABOLIC PANEL - Abnormal; Notable for the following components:      Result Value   Sodium 146 (*)    Glucose, Bld 124 (*)    Calcium 8.7 (*)    All other components within normal limits  ETHANOL - Abnormal; Notable for the following components:   Alcohol, Ethyl (B) 442 (*)    All other components within normal limits  ACETAMINOPHEN LEVEL - Abnormal; Notable for the following components:   Acetaminophen (Tylenol), Serum <10 (*)    All other components within normal limits  RAPID URINE DRUG SCREEN, HOSP PERFORMED - Abnormal; Notable for the following components:   Tetrahydrocannabinol POSITIVE (*)    All other components within normal limits  SARS CORONAVIRUS 2 (TAT 6-24 HRS)  SALICYLATE LEVEL  CBC  I-STAT BETA HCG BLOOD, ED ([facility], [facility], [facility] ONLY)    EKG None  Radiology No results found.  Procedures Procedures (including critical care time)  Medications Ordered in ED Medications - No data to display   Initial Impression / Assessment and Plan / ED Course  I have  reviewed the triage vital signs and the nursing notes.  Pertinent labs & imaging results that were available during my care of the patient were reviewed by me and considered in my medical decision making (see chart for details).  33 year old female here from behavioral health for medical screening exam.  She was evaluated by counselor there and recommended for inpatient treatment.  She does report she has had SI for quite a while, worse after verbal altercation with her mother and mother's boyfriend today.  She does not disclose any specific plan to me.  She denies any heavy alcohol use but does report occasional marijuana use.  Patient's labs here with significantly elevated ethanol of 442.  UDS positive for THC.  Covid swab is pending, this will need to be resulted before patient can be transferred to [facility] for treatment.  Patient has been observed here for several hours.  No acute changes.  COVID swab is still pending-- contacted lab, it is being run at [facility] currently, unsure exactly when it will be resulted.  Morning team to disposition to [facility] once COVID screen results.  Final Clinical Impressions(s) / ED Diagnoses   Final diagnoses:  Suicidal ideation    ED Discharge Orders    None       Larene Pickett, PA-C 01/20/19 0631    Fatima Blank, MD 01/20/19 [phone]

## 2019-01-20 NOTE — H&P (Signed)
Behavioral Health Medical Screening Exam  Alison Koch is an 33 y.o. female.  Total Time spent with patient: 15 minutes  Psychiatric Specialty Exam: Physical Exam  Constitutional: She is oriented to person, place, and time. She appears well-developed and well-nourished. No distress.  HENT:  Head: Normocephalic.  Respiratory: Effort normal. No respiratory distress.  Musculoskeletal: Normal range of motion.  Neurological: She is alert and oriented to person, place, and time.  Skin: She is not diaphoretic.  Psychiatric: Her mood appears anxious. Thought content is not paranoid and not delusional. She expresses impulsivity and inappropriate judgment. She exhibits a depressed mood. She expresses suicidal ideation. She expresses no homicidal ideation. She expresses suicidal plans.    Review of Systems  Constitutional: Negative for chills, diaphoresis, fever, malaise/fatigue and weight loss.  Respiratory: Negative for cough and shortness of breath.   Cardiovascular: Negative for chest pain.  Gastrointestinal: Negative for diarrhea, nausea and vomiting.  Psychiatric/Behavioral: Positive for depression, hallucinations, substance abuse and suicidal ideas. Negative for memory loss. The patient is nervous/anxious and has insomnia.     Blood pressure 98/63, pulse (!) 106, temperature 98.6 F (37 C), temperature source Oral, resp. rate 18, SpO2 98 %.There is no height or weight on file to calculate BMI.  General Appearance: Disheveled  Eye Contact:  Fair  Speech:  Clear and Coherent and Normal Rate  Volume:  Normal  Mood:  Anxious, Depressed, Hopeless and Worthless  Affect:  Depressed  Thought Process:  Coherent  Orientation:  Full (Time, Place, and Person)  Thought Content:  Hallucinations: Auditory Command:  Reports haring voices telling her to kill herelf by cutting her throar Visual  Suicidal Thoughts:  Yes.  with intent/plan  Homicidal Thoughts:  No  Memory:  Immediate;   Good   Judgement:  Impaired  Insight:  Lacking  Psychomotor Activity:  Normal  Concentration: Concentration: Fair and Attention Span: Fair  Recall:  [employer] of Knowledge:Good  Language: Good  Akathisia:  Negative  Handed:  Right  AIMS (if indicated):     Assets:  Communication Skills Desire for Improvement Financial Resources/Insurance Housing Leisure Time Physical Health  Sleep:       Musculoskeletal: Strength & Muscle Tone: within normal limits Gait & Station: normal Patient leans: N/A  Blood pressure 98/63, pulse (!) 106, temperature 98.6 F (37 C), temperature source Oral, resp. rate 18, SpO2 98 %.  Recommendations:  Based on my evaluation the patient does not appear to have an emergency medical condition.   Patient appears intoxicated, although she states last alcohol use was 60 days ago. Will send to [facility] for medial clearance.   Rozetta Nunnery, NP 01/20/2019, 3:33 AM

## 2019-01-20 NOTE — Progress Notes (Signed)
Patient ID: Alison Koch, female   DOB: 03-16-1985, 33 y.o.   MRN: [id number]  D: Pt alert and oriented during [facility] admission process. Pt denies SI/HI, A/VH, and any pain. Pt is cooperative.  A: Education, support, reassurance, and encouragement provided, q15 minute safety checks initiated. Pt's belongings in locker #31.    "HPI: [name] is a 33 year old female with history of SLE in remission, alcohol use disorder (active), and cocaine and heroin use disorder in remission, presenting for treatment of suicidal ideation. She moved from [place] to [place] in February of this year to be closer to her mother, and she reports difficulties since the move. She had left behind a job in [place] but has not been able to secure employment in this area related to the pandemic. Her mother's live-in boyfriend does not get along with the patient and will not allow her in their home. She reports alcohol dependence for years, but her drinking has increased since the move, now drinking 5-6 high-alcohol content beers each day. She was triggered by an argument with her mother on the day of admission. She reports feeling that her mother does not give her sufficient independence. Her mother told her that she was going to lose her job related to accompanying the patient to different appointments for her mental health, and the patient reports she became upset because she had never asked her mother to accompany her to these appointments. She became acutely suicidal and drank a bottle of vodka. Admission BAL 442. She admits to drinking beers daily but denies regular liquor consumption. She reports almost daily marijuana usage. She has remote history of cocaine, ecstasy, and heroin use but states she has been sober from all these substances since she went to rehab in 2010. UDS positive for THC only. She reports starting gabapentin 400 mg TID and Seroquel 150 mg QHS three months ago. She was started on Effexor two weeks ago. She  denies current SI. Denies HI/AVH. She reports nausea with one episode of vomiting this morning. Mild tremor observed. She reports a history of seizures while withdrawing from alcohol."  R: Pt denies any concerns at this time, and verbally contracts for safety. Pt ambulating on the unit with no issues. Pt remains safe on and off the unit.
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
## 2019-01-20 NOTE — Progress Notes (Signed)
Patient ID: Alison Koch, female   DOB: [birth year], 33 y.o.   MRN: [id number]    NOVEL CORONAVIRUS (COVID-19) DAILY CHECK-OFF SYMPTOMS - answer yes or no to each - every day NO YES  Have you had a fever in the past 24 hours?  . Fever (Temp > 37.80C / 100F) X   Have you had any of these symptoms in the past 24 hours? . New Cough .  Sore Throat  .  Shortness of Breath .  Difficulty Breathing .  Unexplained Body Aches   X   Have you had any one of these symptoms in the past 24 hours not related to allergies?   . Runny Nose .  Nasal Congestion .  Sneezing   X   If you have had runny nose, nasal congestion, sneezing in the past 24 hours, has it worsened?  X   EXPOSURES - check yes or no X   Have you traveled outside the state in the past 14 days?  X   Have you been in contact with someone with a confirmed diagnosis of COVID-19 or PUI in the past 14 days without wearing appropriate PPE?  X   Have you been living in the same home as a person with confirmed diagnosis of COVID-19 or a PUI (household contact)?    X   Have you been diagnosed with COVID-19?    X              What to do next: Answered NO to all: Answered YES to anything:   Proceed with unit schedule Follow the [facility] Inpatient Flowsheet.

## 2019-01-20 NOTE — Progress Notes (Signed)
Spiritual care group on grief and loss facilitated by chaplain Jerene Pitch MDiv, [facility]  Group Goal:  Support / Education around grief and loss Members engage in facilitated group support and psycho-social education.  Group Description:  Following introductions and group rules, group members engaged in facilitated group dialog and support around topic of loss, with particular support around experiences of loss in their lives. Group Identified types of loss (relationships / self / things) and identified patterns, circumstances, and changes that precipitate losses. Reflected on thoughts / feelings around loss, normalized grief responses, and recognized variety in grief experience.   Group noted Worden's four tasks of grief in discussion.  Group drew on Adlerian / Rogerian, narrative, MI, Patient Progress:  Present throughout group.  Engaged in topic.  SPoke with others about her process of coming to [facility].  Related loss around moving to [place], isolation due to covid.  Alison Koch had planned to connect with mother in [place], but states that she is not able to due to her mother's boyfriend.  States she is not allowed in mother's house.   Alison Koch related to group discussion about narratives that impact grief and reflected on growing up as an only child.  Reflected with group on coping - especially around using substances.  Spoke with group members about their practice of reframing thoughts when they are becoming overwhelmed.

## 2019-01-21 DIAGNOSIS — F1994 Other psychoactive substance use, unspecified with psychoactive substance-induced mood disorder: Secondary | ICD-10-CM

## 2019-01-21 LAB — HEMOGLOBIN A1C
Hgb A1c MFr Bld: 4.9 % (ref 4.8–5.6)
Mean Plasma Glucose: 93.93 mg/dL

## 2019-01-21 LAB — LIPID PANEL
Cholesterol: 189 mg/dL (ref 0–200)
HDL: 124 mg/dL (ref >40)
LDL Cholesterol: 49 mg/dL (ref 0–99)
Total CHOL/HDL Ratio: 1.5 RATIO
Triglycerides: 82 mg/dL (ref <150)
VLDL: 16 mg/dL (ref 0–40)

## 2019-01-21 LAB — PROTIME-INR
INR: 1.1 (ref 0.8–1.2)
Prothrombin Time: 13.9 seconds (ref 11.4–15.2)

## 2019-01-21 LAB — TSH: TSH: 4.057 u[IU]/mL (ref 0.350–4.500)

## 2019-01-21 MED ORDER — ACAMPROSATE CALCIUM 333 MG PO TBEC
666.0000 mg | DELAYED_RELEASE_TABLET | Freq: Three times a day (TID) | ORAL | Status: DC
  Administered 2019-01-21 – 2019-01-23 (×6): 666 mg via ORAL
  Filled 2019-01-21 (×10): qty 2

## 2019-01-21 MED ORDER — BOOST / RESOURCE BREEZE PO LIQD CUSTOM
1.0000 | Freq: Two times a day (BID) | ORAL | Status: DC
  Administered 2019-01-21 – 2019-01-27 (×9): 1 via ORAL
  Filled 2019-01-21 (×17): qty 1

## 2019-01-21 MED ORDER — QUETIAPINE FUMARATE 100 MG PO TABS
100.0000 mg | ORAL_TABLET | Freq: Every day | ORAL | Status: DC
  Administered 2019-01-21: 100 mg via ORAL
  Filled 2019-01-21 (×2): qty 1

## 2019-01-21 MED ORDER — TRAZODONE HCL 50 MG PO TABS
50.0000 mg | ORAL_TABLET | Freq: Every evening | ORAL | Status: DC | PRN
  Administered 2019-01-21 – 2019-01-22 (×2): 50 mg via ORAL
  Filled 2019-01-21 (×2): qty 1

## 2019-01-21 MED ORDER — GABAPENTIN 400 MG PO CAPS
400.0000 mg | ORAL_CAPSULE | Freq: Three times a day (TID) | ORAL | Status: DC
  Administered 2019-01-21 – 2019-01-23 (×6): 400 mg via ORAL
  Filled 2019-01-21 (×12): qty 1

## 2019-01-21 NOTE — Progress Notes (Signed)
[facility] MD Progress Note  01/21/2019 1:08 PM Alison Koch  MRN:  [id number] Subjective: Patient is a 33 year old female who presented to the behavioral health hospital as a walk-in on 01/19/2019 accompanied by [facility] department for suicidal ideation.  Patient stated at that point that she had gotten into an argument with her mother and boyfriend and just stated that she "wanted to die".  She was admitted to the hospital for evaluation and stabilization.  Objective: Patient is seen and examined.  Patient is a 33 year old female with a past psychiatric history significant for alcohol use disorder, cocaine use disorder, heroin use disorder and suicidal ideation.  Patient had a history of sobriety, but then started using [place] drugs such as marijuana, and then advanced to alcohol.  She had been sober for opiates but then began to experiment with marijuana and that advanced to alcohol.  Her admission blood alcohol level was 442.  She admitted to daily marijuana use, and remote drug use in the past.  She complained of increased anxiety as well as poor sleep.  She had been previously treated with gabapentin 400 mg p.o. 3 times daily but on admission this had been reduced to 300 mg p.o. 3 times daily, and as well her Seroquel was restarted but only at 50 mg p.o. nightly and she had previously taken somewhere between 100 to 150 mg p.o. nightly.  We also discussed the possibility of going on Campral for alcohol dependence as well.  She denied any current suicidal ideation.  Her blood pressure is elevated today at 140/99.  Her pulse is regular.  She is afebrile.  She slept 6 hours last night.  Her most recent CIWA this morning was 0.  On admission her liver function enzymes were normal.  CBC with differential including MCV was normal.  Blood alcohol on admission was 442, drug screen was positive for marijuana.  Principal Problem: <principal problem not specified> Diagnosis: Active Problems:   Severe recurrent major  depression with psychotic features (HCC)   Alcohol dependence with alcohol-induced mood disorder (HCC)  Total Time spent with patient: 20 minutes  Past Psychiatric History: See admission H&P  Past Medical History:  Past Medical History:  Diagnosis Date  . Lupus (HCC)    History reviewed. No pertinent surgical history. Family History: History reviewed. No pertinent family history. Family Psychiatric  History: See admission H&P Social History:  Social History   Substance and Sexual Activity  Alcohol Use None     Social History   Substance and Sexual Activity  Drug Use Not on file    Social History   Socioeconomic History  . Marital status: Single    Spouse name: Not on file  . Number of children: Not on file  . Years of education: Not on file  . Highest education level: Not on file  Occupational History  . Not on file  Social Needs  . Financial resource strain: Not on file  . Food insecurity    Worry: Not on file    Inability: Not on file  . Transportation needs    Medical: Not on file    Non-medical: Not on file  Tobacco Use  . Smoking status: Not on file  Substance and Sexual Activity  . Alcohol use: Not on file  . Drug use: Not on file  . Sexual activity: Not on file  Lifestyle  . Physical activity    Days per week: Not on file    Minutes per session: Not on file  .  Stress: Not on file  Relationships  . Social Musicianconnections    Talks on phone: Not on file    Gets together: Not on file    Attends religious service: Not on file    Active member of club or organization: Not on file    Attends meetings of clubs or organizations: Not on file    Relationship status: Not on file  Other Topics Concern  . Not on file  Social History Narrative  . Not on file   Additional Social History:                         Sleep: Fair  Appetite:  Fair  Current Medications: Current Facility-Administered Medications  Medication Dose Route Frequency Provider  Last Rate Last Dose  . acamprosate (CAMPRAL) tablet 666 mg  666 mg Oral TID WC Antonieta Pertlary, Leroy Pettway Lawson, MD   666 mg at 01/21/19 1136  . alum & mag hydroxide-simeth (MAALOX/MYLANTA) 200-200-20 MG/5ML suspension 30 mL  30 mL Oral Q4H PRN Nira ConnBerry, Jason A, NP      . feeding supplement (BOOST / RESOURCE BREEZE) liquid 1 Container  1 Container Oral BID BM Cobos, Rockey SituFernando A, MD   1 Container at 01/21/19 1143  . gabapentin (NEURONTIN) capsule 400 mg  400 mg Oral TID Antonieta Pertlary, Anda Sobotta Lawson, MD   400 mg at 01/21/19 1136  . hydrOXYzine (ATARAX/VISTARIL) tablet 25 mg  25 mg Oral TID PRN Jackelyn PolingBerry, Jason A, NP      . LORazepam (ATIVAN) tablet 1 mg  1 mg Oral Q6H PRN Nira ConnBerry, Jason A, NP      . magnesium hydroxide (MILK OF MAGNESIA) suspension 30 mL  30 mL Oral Daily PRN Nira ConnBerry, Jason A, NP      . multivitamin with minerals tablet 1 tablet  1 tablet Oral Daily Nira ConnBerry, Jason A, NP   1 tablet at 01/21/19 0816  . ondansetron (ZOFRAN-ODT) disintegrating tablet 4 mg  4 mg Oral Q6H PRN Nira ConnBerry, Jason A, NP      . QUEtiapine (SEROQUEL) tablet 100 mg  100 mg Oral QHS Antonieta Pertlary, Tonia Avino Lawson, MD      . thiamine (VITAMIN B-1) tablet 100 mg  100 mg Oral Daily Nira ConnBerry, Jason A, NP   100 mg at 01/21/19 0816  . traZODone (DESYREL) tablet 50 mg  50 mg Oral QHS PRN Antonieta Pertlary, Marlon Vonruden Lawson, MD      . venlafaxine XR [facility](EFFEXOR-XR) 24 hr capsule 75 mg  75 mg Oral Q breakfast Cobos, Rockey SituFernando A, MD   75 mg at 01/21/19 [id number]    Lab Results:  Results for orders placed or performed during the hospital encounter of 01/20/19 (from the past 48 hour(s))  Hemoglobin A1c     Status: None   Collection Time: 01/21/19  6:18 AM  Result Value Ref Range   Hgb A1c MFr Bld 4.9 4.8 - 5.6 %    Comment: (NOTE) Pre diabetes:          5.7%-6.4% Diabetes:              >6.4% Glycemic control for   <7.0% adults with diabetes    Mean Plasma Glucose 93.93 mg/dL    Comment: Performed at [facility], [address] [place], [place] [address]  Lipid panel     Status: None    Collection Time: 01/21/19  6:18 AM  Result Value Ref Range   Cholesterol 189 0 - 200 mg/dL   Triglycerides 82 <  150 mg/dL   HDL 299 >24 mg/dL   Total CHOL/HDL Ratio 1.5 RATIO   VLDL 16 0 - 40 mg/dL   LDL Cholesterol 49 0 - 99 mg/dL    Comment:        Total Cholesterol/HDL:CHD Risk Coronary Heart Disease Risk Table                     Men   Women  1/2 Average Risk   3.4   3.3  Average Risk       5.0   4.4  2 X Average Risk   9.6   7.1  3 X Average Risk  23.4   11.0        Use the calculated Patient Ratio above and the CHD Risk Table to determine the patient's CHD Risk.        ATP III CLASSIFICATION (LDL):  <100     mg/dL   Optimal  268-341  mg/dL   Near or Above                    Optimal  130-159  mg/dL   Borderline  962-229  mg/dL   High  >798     mg/dL   Very High Performed at [facility], [address], [place], [place] [address]   TSH     Status: None   Collection Time: 01/21/19  6:18 AM  Result Value Ref Range   TSH 4.057 0.350 - 4.500 uIU/mL    Comment: Performed by a 3rd Generation assay with a functional sensitivity of <=0.01 uIU/mL. Performed at [facility], [address] [place], [place] [address]   Protime-INR     Status: None   Collection Time: 01/21/19  6:18 AM  Result Value Ref Range   Prothrombin Time 13.9 11.4 - 15.2 seconds   INR 1.1 0.8 - 1.2    Comment: (NOTE) INR goal varies based on device and disease states. Performed at [facility], [address] [place], [place] [address]     Blood Alcohol level:  Lab Results  Component Value Date   ETH 442 [facility]) 01/19/2019   ETH 474 (HH) 09/12/2018    Metabolic Disorder Labs: Lab Results  Component Value Date   HGBA1C 4.9 01/21/2019   MPG 93.93 01/21/2019   No results found for: PROLACTIN Lab Results  Component Value Date   CHOL 189 01/21/2019   TRIG 82 01/21/2019   HDL 124 01/21/2019   CHOLHDL 1.5 01/21/2019   VLDL 16  01/21/2019   LDLCALC 49 01/21/2019    Physical Findings: AIMS: Facial and Oral Movements Muscles of Facial Expression: None, normal Lips and Perioral Area: None, normal Jaw: None, normal Tongue: None, normal,Extremity Movements Upper (arms, wrists, hands, fingers): None, normal Lower (legs, knees, ankles, toes): None, normal, Trunk Movements Neck, shoulders, hips: None, normal, Overall Severity Severity of abnormal movements (highest score from questions above): None, normal Incapacitation due to abnormal movements: None, normal Patient's awareness of abnormal movements (rate only patient's report): No Awareness, Dental Status Current problems with teeth and/or dentures?: No  CIWA:  CIWA-Ar Total: 0 COWS:  COWS Total Score: 4  Musculoskeletal: Strength & Muscle Tone: within normal limits Gait & Station: normal Patient leans: N/A  Psychiatric Specialty Exam: Physical Exam  Nursing note and vitals reviewed. Constitutional: She is oriented to person, place, and time. She appears well-developed and well-nourished.  HENT:  Head: Normocephalic and atraumatic.  Respiratory: Effort normal.  Neurological: She is alert and oriented to person, place, and time.    ROS  Blood pressure (!) 140/99, pulse 90, temperature 98.8 F (37.1 C), temperature source Oral, resp. rate 20, height 5\' 5"  (1.651 m), weight 62.1 kg, SpO2 100 %.Body mass index is 22.8 kg/m.  General Appearance: Casual  Eye Contact:  Fair  Speech:  Normal Rate  Volume:  Normal  Mood:  Anxious  Affect:  Congruent  Thought Process:  Coherent and Descriptions of Associations: Intact  Orientation:  Full (Time, Place, and Person)  Thought Content:  Logical  Suicidal Thoughts:  No  Homicidal Thoughts:  No  Memory:  Immediate;   Fair Recent;   Fair Remote;   Fair  Judgement:  Intact  Insight:  Fair  Psychomotor Activity:  Increased  Concentration:  Concentration: Fair and Attention Span: Fair  Recall:  [employer] of  Knowledge:  Fair  Language:  Good  Akathisia:  Negative  Handed:  Right  AIMS (if indicated):     Assets:  Desire for Improvement Physical Health  ADL's:  Intact  Cognition:  WNL  Sleep:  Number of Hours: 6     Treatment Plan Summary: Daily contact with patient to assess and evaluate symptoms and progress in treatment, Medication management and Plan : Patient is seen and examined.  Patient is a 33 year old female with the above-stated past psychiatric history who is seen in follow-up.   Diagnosis: #1 substance-induced mood disorder, #2 alcohol dependence, #3 history of opiate dependence, #4 cannabis dependence  Patient is seen in follow-up.  She is doing well with her withdrawal symptoms.  The only changes I am to make today are increasing her gabapentin back up to 400 mg p.o. 3 times daily for anxiety, and is well increasing her Seroquel 200 mg p.o. nightly.  No other changes in her current medications. 1.  Start Campral 666 mg p.o. 3 times daily with food for alcohol dependence. 2.  Increase gabapentin to 400 mg p.o. 3 times daily for anxiety and chronic pain. 3.  Continue hydroxyzine 25 mg p.o. 3 times daily as needed anxiety. 4.  Continue lorazepam 1 mg p.o. every 6 hours as needed a CIWA greater than 10. 5.  Continue multivitamin 1 tablet p.o. daily for nutritional supplementation. 6.  Increase Seroquel 200 mg p.o. nightly for mood stability and sleep. 7.  Continue thiamine 100 mg p.o. daily for nutritional supplementation. 8.  Continue trazodone 50 mg p.o. nightly as needed insomnia. 9.  Continue venlafaxine extended release 75 mg p.o. daily for depression and anxiety. 10.  Disposition planning-in progress.  Sharma Covert, MD 01/21/2019, 1:08 PM

## 2019-01-21 NOTE — [facility] Group Notes (Signed)
Adult Psychoeducational Group Note  Date:  01/21/2019 Time:  11:04 PM  Group Topic/Focus:  Wrap-Up Group:   The focus of this group is to help patients review their daily goal of treatment and discuss progress on daily workbooks.  Participation Level:  Active  Participation Quality:  Appropriate and Attentive  Affect:  Appropriate  Cognitive:  Alert and Appropriate  Insight: Appropriate and Good  Engagement in Group:  Engaged  Modes of Intervention:  Discussion and Education  Additional Comments:  Pt attended and participated in wrap up group this evening and rated their day a 3/10. Pt stated that they made more of an effort to open up. Pt completed their goal, which was to be more aware of their surroundings and to know what effects their anxiety.   Cristi Loron 01/21/2019, 11:04 PM

## 2019-01-21 NOTE — Progress Notes (Signed)
Patient attended the CPSS led Wellness Recovery Action Plan (WRAP) group located in the [facility] 300 hall dayroom. The group's focus was to indentify wellness tools that might positively affect or triggers that might negatively affect a person's overall mental health/substance use recovery process. Patient actively participated in group while being able identify several personal wellness tools and triggers that might positively or negatively affect her overall mental health/substance use recovery process.

## 2019-01-21 NOTE — Progress Notes (Signed)
NUTRITION ASSESSMENT  Pt identified as at risk on the Malnutrition Screen Tool  INTERVENTION: 1. Supplements: Boost Breeze po BID, each supplement provides 250 kcal and 9 grams of protein  NUTRITION DIAGNOSIS: Unintentional weight loss related to sub-optimal intake as evidenced by pt report.   Goal: Pt to meet >/= 90% of their estimated nutrition needs.  Monitor:  PO intake  Assessment:  Pt admitted with depression and substance abuse (ETOH, THC). Pt reports drinking 5-6 beers daily. PTA she drank a bottle of vodka. Per weight records, pt has lost 5 lbs since 7/9, insignificant for time frame. Will order Boost Breeze supplements for additional kcals and protein.  Height: Ht Readings from Last 1 Encounters:  01/20/19 5\' 5"  (1.651 m)    Weight: Wt Readings from Last 1 Encounters:  01/20/19 62.1 kg    Weight Hx: Wt Readings from Last 10 Encounters:  01/20/19 62.1 kg  09/12/18 64.4 kg    BMI:  Body mass index is 22.8 kg/m. Pt meets criteria for normal based on current BMI.  Estimated Nutritional Needs: Kcal: 25-30 kcal/kg Protein: > 1 gram protein/kg Fluid: 1 ml/kcal  Diet Order:  Diet Order            Diet regular Room service appropriate? Yes; Fluid consistency: Thin  Diet effective now             Pt is also offered choice of unit snacks mid-morning and mid-afternoon.  Pt is eating as desired.   Lab results and medications reviewed.   Clayton Bibles, MS, RD, LDN Inpatient Clinical Dietitian Pager: [phone] After Hours Pager: [phone]

## 2019-01-21 NOTE — Progress Notes (Signed)
D:   Patient has poor sleep, sleep medication not helpful.  Poor appetite, low energy level, good concentration.  Rated depression and hopeless 5, anxiety 9.  Withdrawals, chilling, agitation, nausea.  Denied SI.  Checked lightheaded and dizzy.  Denied physical pain.  Goal is anxiety and sleep.  Plans to sleep.  Not sleeping at all, day 3 with no sleep. A:  Medications administered per MD orders.  Emotional support and encouragement given patient. R:  Denied SI and HI, contracts for safety.  Denied A/V hallucinations.  Safety maintained with 15 minute checks.

## 2019-01-21 NOTE — [facility] Group Notes (Signed)
Adult Psychoeducational Group Note  Date:  01/21/2019 Time:  12:14 AM  Group Topic/Focus:  Wrap-Up Group:   The focus of this group is to help patients review their daily goal of treatment and discuss progress on daily workbooks.  Participation Level:  Active  Participation Quality:  Appropriate  Affect:  Appropriate  Cognitive:  Appropriate  Insight: Appropriate  Engagement in Group:  Engaged  Modes of Intervention:  Discussion  Additional Comments:  Pt stated her goal was to better manage anxiety and get some sleep.  Pt stated her goal is on-going and rated the day at a 2/10.  Alison Koch 01/21/2019, 12:14 AM

## 2019-01-21 NOTE — Progress Notes (Signed)
Recreation Therapy Notes  Animal-Assisted Activity (AAA) Program Checklist/Progress Notes Patient Eligibility Criteria Checklist & Daily Group note for Rec Tx Intervention  Date: 11.17.20 Time: 1445 Location: 300 Hall Group Room   AAA/T Program Assumption of Risk Form signed by Patient/ or Parent Legal Guardian  YES   Patient is free of allergies or sever asthma  YES   Patient reports no fear of animals  YES   Patient reports no history of cruelty to animals  YES   Patient understands his/her participation is voluntary YES   Patient washes hands before animal contact  YES   Patient washes hands after animal contact  YES   Behavioral Response: Engaged  Education: Contractor, Appropriate Animal Interaction   Education Outcome: Acknowledges understanding/In group clarification offered/Needs additional education.   Clinical Observations/Feedback: Pt attended and participated in activity.   Alison Koch, LRT/CTRS         Alison Koch, Alison Koch 01/21/2019 3:29 PM

## 2019-01-22 MED ORDER — QUETIAPINE FUMARATE 200 MG PO TABS
200.0000 mg | ORAL_TABLET | Freq: Every day | ORAL | Status: DC
  Administered 2019-01-22: 22:00:00 200 mg via ORAL
  Filled 2019-01-22 (×2): qty 1

## 2019-01-22 MED ORDER — NICOTINE 21 MG/24HR TD PT24
21.0000 mg | MEDICATED_PATCH | Freq: Every day | TRANSDERMAL | Status: DC
  Administered 2019-01-22 – 2019-01-25 (×4): 21 mg via TRANSDERMAL
  Filled 2019-01-22 (×6): qty 1

## 2019-01-22 NOTE — [facility] Group Notes (Signed)
Occupational Therapy Group Note  Date:  01/22/2019 Time:  12:44 PM  Group Topic/Focus:  Self Esteem Action Plan:   The focus of this group is to help patients create a plan to continue to build self-esteem after discharge.  Participation Level:  Active  Participation Quality:  Appropriate  Affect:  Appropriate  Cognitive:  Appropriate  Insight: Improving  Engagement in Group:  Engaged  Modes of Intervention:  Activity, Discussion, Education and Socialization  Additional Comments:    S: I have difficulty saying no  O: Education given on self esteem and how it relates to daily experiences. Pt asked to give definition of self esteem, and current rating of self esteem. Positive and negative contributors of self esteem to be brainstormed within group in relation to personal experiences.   A: pt engaged in discussion, stating that she has issues saying no. She shares that treating her self helps to increase her self esteem. She is very engaged and conversive with other members of the group.  P: OT group will be x1 per week while pt inpatient  Zenovia Jarred, MSOT, OTR/L [place] Office: [place] 01/22/2019, 12:44 PM

## 2019-01-22 NOTE — Progress Notes (Signed)
Recreation Therapy Notes  Date:  11.18.20 Time: 0930 Location: 300 Hall Dayroom  Group Topic: Stress Management  Goal Area(s) Addresses:  Patient will identify positive stress management techniques. Patient will identify benefits of using stress management post d/c.  Behavioral Response:  Engaged  Intervention: Stress Management  Activity :  Pure Possibilities Meditation.  LRT played a meditation that focused on making the most of your day and taking each moment as a new opportunity.  Education:  Stress Management, Discharge Planning.   Education Outcome: Acknowledges Education  Clinical Observations/Feedback: Pt attended and participated in activity.      Elbie Statzer Ahmaya, LRT/CTRS         Jalila, Byrne Capek A 01/22/2019 11:45 AM 

## 2019-01-22 NOTE — Tx Team (Signed)
Interdisciplinary Treatment and Diagnostic Plan Update  01/22/2019 Time of Session: 9:00am Georgann Bramble MRN: [id number]  Principal Diagnosis: <principal problem not specified>  Secondary Diagnoses: Active Problems:   Severe recurrent major depression with psychotic features (HCC)   Alcohol dependence with alcohol-induced mood disorder (HCC)   Current Medications:  Current Facility-Administered Medications  Medication Dose Route Frequency Provider Last Rate Last Dose  . acamprosate (CAMPRAL) tablet 666 mg  666 mg Oral TID WC Sharma Covert, MD   666 mg at 01/22/19 [phone]  . alum & mag hydroxide-simeth (MAALOX/MYLANTA) 200-200-20 MG/5ML suspension 30 mL  30 mL Oral Q4H PRN Lindon Romp A, NP      . feeding supplement (BOOST / RESOURCE BREEZE) liquid 1 Container  1 Container Oral BID BM Cobos, Myer Peer, MD   1 Container at 01/22/19 [phone]  . gabapentin (NEURONTIN) capsule 400 mg  400 mg Oral TID Sharma Covert, MD   400 mg at 01/22/19 0810  . hydrOXYzine (ATARAX/VISTARIL) tablet 25 mg  25 mg Oral TID PRN Rozetta Nunnery, NP      . LORazepam (ATIVAN) tablet 1 mg  1 mg Oral Q6H PRN Lindon Romp A, NP      . magnesium hydroxide (MILK OF MAGNESIA) suspension 30 mL  30 mL Oral Daily PRN Lindon Romp A, NP      . multivitamin with minerals tablet 1 tablet  1 tablet Oral Daily Lindon Romp A, NP   1 tablet at 01/22/19 0810  . nicotine (NICODERM CQ - dosed in mg/24 hours) patch 21 mg  21 mg Transdermal Daily Sharma Covert, MD   21 mg at 01/22/19 0904  . ondansetron (ZOFRAN-ODT) disintegrating tablet 4 mg  4 mg Oral Q6H PRN Lindon Romp A, NP   4 mg at 01/21/19 2048  . QUEtiapine (SEROQUEL) tablet 200 mg  200 mg Oral QHS Sharma Covert, MD      . thiamine (VITAMIN B-1) tablet 100 mg  100 mg Oral Daily Lindon Romp A, NP   100 mg at 01/22/19 0810  . traZODone (DESYREL) tablet 50 mg  50 mg Oral QHS PRN Sharma Covert, MD   50 mg at 01/21/19 2047  . venlafaxine XR (EFFEXOR-XR) 24 hr  capsule 75 mg  75 mg Oral Q breakfast Cobos, Myer Peer, MD   75 mg at 01/22/19 0810   PTA Medications: Medications Prior to Admission  Medication Sig Dispense Refill Last Dose  . albuterol (VENTOLIN HFA) 108 (90 Base) MCG/ACT inhaler Inhale 2 puffs into the lungs every 6 (six) hours as needed for wheezing or shortness of breath.      . gabapentin (NEURONTIN) 400 MG capsule Take 400 mg by mouth 3 (three) times daily.     . hydrOXYzine (ATARAX/VISTARIL) 50 MG tablet Take 25 mg by mouth 3 (three) times daily as needed for anxiety, itching or nausea.      Marland Kitchen ibuprofen (ADVIL) 800 MG tablet Take 1 tablet (800 mg total) by mouth 3 (three) times daily. (Patient taking differently: Take 800 mg by mouth every 8 (eight) hours as needed for fever, headache, mild pain, moderate pain or cramping. ) 21 tablet 0   . meclizine (ANTIVERT) 25 MG tablet Take 25 mg by mouth 3 (three) times daily as needed for dizziness.      . Melatonin (MELATONIN MAXIMUM STRENGTH) 5 MG TABS Take 5 mg by mouth at bedtime.     . Multiple Vitamin (MULTI-VITAMIN) tablet Take 1 tablet by mouth  daily.     . progesterone (PROMETRIUM) 200 MG capsule Take 200 mg by mouth See admin instructions. Only takes for 10 days out of each month, days vary     . QUEtiapine (SEROQUEL) 100 MG tablet Take 100-150 mg by mouth at bedtime as needed (sleep).      . thiamine (VITAMIN B-1) 100 MG tablet Take 100 mg by mouth daily.     . traZODone (DESYREL) 50 MG tablet Take 50 mg by mouth at bedtime as needed.     . venlafaxine XR (EFFEXOR-XR) 75 MG 24 hr capsule Take 75 mg by mouth daily.       Patient Stressors: Marital or family conflict Substance abuse  Patient Strengths: Capable of independent living Special hobby/interest Work skills  Treatment Modalities: Medication Management, Group therapy, Case management,  1 to 1 session with clinician, Psychoeducation, Recreational therapy.   Physician Treatment Plan for Primary Diagnosis: <principal  problem not specified> Long Term Goal(s): Improvement in symptoms so as ready for discharge Improvement in symptoms so as ready for discharge   Short Term Goals: Ability to identify changes in lifestyle to reduce recurrence of condition will improve Ability to verbalize feelings will improve Ability to disclose and discuss suicidal ideas Ability to demonstrate self-control will improve Ability to identify and develop effective coping behaviors will improve  Medication Management: Evaluate patient's response, side effects, and tolerance of medication regimen.  Therapeutic Interventions: 1 to 1 sessions, Unit Group sessions and Medication administration.  Evaluation of Outcomes: Progressing  Physician Treatment Plan for Secondary Diagnosis: Active Problems:   Severe recurrent major depression with psychotic features (HCC)   Alcohol dependence with alcohol-induced mood disorder (HCC)  Long Term Goal(s): Improvement in symptoms so as ready for discharge Improvement in symptoms so as ready for discharge   Short Term Goals: Ability to identify changes in lifestyle to reduce recurrence of condition will improve Ability to verbalize feelings will improve Ability to disclose and discuss suicidal ideas Ability to demonstrate self-control will improve Ability to identify and develop effective coping behaviors will improve     Medication Management: Evaluate patient's response, side effects, and tolerance of medication regimen.  Therapeutic Interventions: 1 to 1 sessions, Unit Group sessions and Medication administration.  Evaluation of Outcomes: Progressing   RN Treatment Plan for Primary Diagnosis: <principal problem not specified> Long Term Goal(s): Knowledge of disease and therapeutic regimen to maintain health will improve  Short Term Goals: Ability to identify and develop effective coping behaviors will improve and Compliance with prescribed medications will improve  Medication  Management: RN will administer medications as ordered by provider, will assess and evaluate patient's response and provide education to patient for prescribed medication. RN will report any adverse and/or side effects to prescribing provider.  Therapeutic Interventions: 1 on 1 counseling sessions, Psychoeducation, Medication administration, Evaluate responses to treatment, Monitor vital signs and CBGs as ordered, Perform/monitor CIWA, COWS, AIMS and Fall Risk screenings as ordered, Perform wound care treatments as ordered.  Evaluation of Outcomes: Progressing   LCSW Treatment Plan for Primary Diagnosis: <principal problem not specified> Long Term Goal(s): Safe transition to appropriate next level of care at discharge, Engage patient in therapeutic group addressing interpersonal concerns.  Short Term Goals: Engage patient in aftercare planning with referrals and resources, Increase social support, Increase emotional regulation, Identify triggers associated with mental health/substance abuse issues and Increase skills for wellness and recovery  Therapeutic Interventions: Assess for all discharge needs, 1 to 1 time with Social worker, Explore available  resources and support systems, Assess for adequacy in community support network, Educate family and significant other(s) on suicide prevention, Complete Psychosocial Assessment, Interpersonal group therapy.  Evaluation of Outcomes: Progressing   Progress in Treatment: Attending groups: Yes. Participating in groups: Yes. Taking medication as prescribed: Yes. Toleration medication: Yes. Still having trouble sleeping. Family/Significant other contact made: No, will contact:  mother, Marcelino DusterMichelle Patient understands diagnosis: Yes. Discussing patient identified problems/goals with staff: Yes. Medical problems stabilized or resolved: No. Denies suicidal/homicidal ideation: Yes. Issues/concerns per patient self-inventory: Yes.  New problem(s)  identified: Yes, Describe:  limited social supports.  New Short Term/Long Term Goal(s): detox, medication management for mood stabilization; elimination of SI thoughts; development of comprehensive mental wellness/sobriety plan.  Patient Goals:  "Get my anxiety under control, I know it effects my insomnia."  Discharge Plan or Barriers: Returning home, following up with her psychiatrist and [facility].   Reason for Continuation of Hospitalization: Anxiety Depression Medication stabilization  Estimated Length of Stay: 1-3 days  Attendees: Patient: Alison DuralLindsay Linzy 01/22/2019 10:12 AM  Physician: Marguerita Merlesr.Clary 01/22/2019 10:12 AM  Nursing: Meriam SpragueBeverly, RN 01/22/2019 10:12 AM  RN Care Manager: 01/22/2019 10:12 AM  Social Worker: Enid Cutterharlotte Grayer Sproles, LCSWA 01/22/2019 10:12 AM  Recreational Therapist:  01/22/2019 10:12 AM  Other: Marciano SequinJanet Sykes, NP 01/22/2019 10:12 AM  Other:  01/22/2019 10:12 AM  Other: 01/22/2019 10:12 AM    Scribe for Treatment Team: Darreld Mcleanharlotte C Audris Speaker, LCSWA 01/22/2019 10:12 AM

## 2019-01-22 NOTE — Plan of Care (Signed)
Nurse discussed anxiety, depression and coping skills with patient.  

## 2019-01-22 NOTE — Progress Notes (Signed)
[facility] Group Notes:  (Nursing/MHT/Case Management/Adjunct)  Date:  01/22/2019  Time:  2030  Type of Therapy:  wrap up group  Participation Level:  Active  Participation Quality:  Appropriate, Attentive, Sharing and Supportive  Affect:  Appropriate  Cognitive:  Appropriate  Insight:  Improving  Engagement in Group:  Engaged  Modes of Intervention:  Clarification, Education and Support  Summary of Progress/Problems: Pt reports enjoying the group therapy today. Pt shared her favorite person, place, and sound with the group. Pt likes that she is energetic. Positive thinking and positive change was discussed.   Joelynn, Dust 01/22/2019, 10:19 PM

## 2019-01-22 NOTE — Progress Notes (Signed)
D:  Patient's self inventory sheet, patient has poor sleep, sleep medication not helpful.  Poor appetite, normal energy level, good concentration.  Rated depression 6, hopeless 5, anxiety 9.  Withdrawals, chilling, cravings, nausea.  Denied SI.  Physical problems, lightheaded, dizzy.  Denied physical pain.  Goal is anxiety, rest.  Plans to talk to MD, rest.  Not sleeping well, nausea.  No discharge plans. A:  Medications administered per MD orders.  Emotional support and encouragement given patient. R:  Denied SI and HI, contracts for safety.  Denied A/V hallucinations.  Safety maintained with 15 minute checks.

## 2019-01-22 NOTE — [facility] Group Notes (Signed)
01/22/2019 8:45am Type of Group and Topic: Psychoeducational Group: Discharge Planning  Participation Level: Active  Description of Group Discharge planning group reviews patient's anticipated discharge plans and assists patients to anticipate and address any barriers to wellness/recovery in the community. Suicide prevention education is reviewed with patients in group. Therapeutic Goals 1. Patients will state their anticipated discharge plan and mental health aftercare 2. Patients will identify potential barriers to wellness in the community setting 3. Patients will engage in problem solving, solution focused discussion of ways to anticipate and address barriers to wellness/recovery   Summary of Patient Progress:  Reports she feels "great" about discharge plans. She reports that she is excited about starting the partial hospitalization program at discharge. She reports that she is nervous about returning home, alone, however she feels the outpatient follow up with provide the support she needs.   Plan for Discharge/Comments: Patient plans to return home alone  Transportation Means: To be determined    Supports: Patient's mother    Therapeutic Modalities: Motivational Interviewing     Radonna Ricker, MSW, [place] Worker [facility]  Phone: [phone] 01/22/2019 2:56 PM

## 2019-01-22 NOTE — Progress Notes (Signed)
[facility] MD Progress Note  01/22/2019 12:14 PM Alison Koch  MRN:  [id number] Subjective:  Patient is a 33 year old female who presented to the behavioral health hospital as a walk-in on 01/19/2019 accompanied by [facility] department for suicidal ideation.  Patient stated at that point that she had gotten into an argument with her mother and boyfriend and just stated that she "wanted to die".  She was admitted to the hospital for evaluation and stabilization.  Objective: Patient is seen and examined.  Patient is a 33 year old female with the above-stated past psychiatric history who is seen in follow-up.  She is more tearful today.  I think a lot of what is going on recently is finally beginning to get to her.  She also complains of increased anxiety.  We discussed how this might be alcohol withdrawal symptoms.  She denied any side effects to her medications, but she was unaware that she had hydroxyzine available for her for anxiety, and she is going to try that today.  She denied suicidal ideation, and we discussed her long-term substance issues.  Her vital signs are stable, she is afebrile.  Her most recent CIWA was only 1 this morning.  She slept 6.75 hours last night.  Her hemoglobin A1c from 11/17 was 4.9.  Beta-hCG was negative.  Her TSH was 4.057.  Principal Problem: <principal problem not specified> Diagnosis: Active Problems:   Severe recurrent major depression with psychotic features (HCC)   Alcohol dependence with alcohol-induced mood disorder (HCC)  Total Time spent with patient: 20 minutes  Past Psychiatric History: See admission H&P  Past Medical History:  Past Medical History:  Diagnosis Date  . Lupus (HCC)    History reviewed. No pertinent surgical history. Family History: History reviewed. No pertinent family history. Family Psychiatric  History: See admission H&P Social History:  Social History   Substance and Sexual Activity  Alcohol Use None     Social History    Substance and Sexual Activity  Drug Use Not on file    Social History   Socioeconomic History  . Marital status: Single    Spouse name: Not on file  . Number of children: Not on file  . Years of education: Not on file  . Highest education level: Not on file  Occupational History  . Not on file  Social Needs  . Financial resource strain: Not on file  . Food insecurity    Worry: Not on file    Inability: Not on file  . Transportation needs    Medical: Not on file    Non-medical: Not on file  Tobacco Use  . Smoking status: Not on file  Substance and Sexual Activity  . Alcohol use: Not on file  . Drug use: Not on file  . Sexual activity: Not on file  Lifestyle  . Physical activity    Days per week: Not on file    Minutes per session: Not on file  . Stress: Not on file  Relationships  . Social Musician on phone: Not on file    Gets together: Not on file    Attends religious service: Not on file    Active member of club or organization: Not on file    Attends meetings of clubs or organizations: Not on file    Relationship status: Not on file  Other Topics Concern  . Not on file  Social History Narrative  . Not on file   Additional Social History:  Sleep: Good  Appetite:  Fair  Current Medications: Current Facility-Administered Medications  Medication Dose Route Frequency Provider Last Rate Last Dose  . acamprosate (CAMPRAL) tablet 666 mg  666 mg Oral TID WC Antonieta Pert, MD   666 mg at 01/22/19 1153  . alum & mag hydroxide-simeth (MAALOX/MYLANTA) 200-200-20 MG/5ML suspension 30 mL  30 mL Oral Q4H PRN Nira Conn A, NP      . feeding supplement (BOOST / RESOURCE BREEZE) liquid 1 Container  1 Container Oral BID BM Cobos, Rockey Situ, MD   1 Container at 01/22/19 [id number]  . gabapentin (NEURONTIN) capsule 400 mg  400 mg Oral TID Antonieta Pert, MD   400 mg at 01/22/19 1153  . hydrOXYzine (ATARAX/VISTARIL) tablet  25 mg  25 mg Oral TID PRN Nira Conn A, NP   25 mg at 01/22/19 1027  . LORazepam (ATIVAN) tablet 1 mg  1 mg Oral Q6H PRN Nira Conn A, NP      . magnesium hydroxide (MILK OF MAGNESIA) suspension 30 mL  30 mL Oral Daily PRN Nira Conn A, NP      . multivitamin with minerals tablet 1 tablet  1 tablet Oral Daily Nira Conn A, NP   1 tablet at 01/22/19 0810  . nicotine (NICODERM CQ - dosed in mg/24 hours) patch 21 mg  21 mg Transdermal Daily Antonieta Pert, MD   21 mg at 01/22/19 0904  . ondansetron (ZOFRAN-ODT) disintegrating tablet 4 mg  4 mg Oral Q6H PRN Nira Conn A, NP   4 mg at 01/21/19 2048  . QUEtiapine (SEROQUEL) tablet 200 mg  200 mg Oral QHS Antonieta Pert, MD      . thiamine (VITAMIN B-1) tablet 100 mg  100 mg Oral Daily Nira Conn A, NP   100 mg at 01/22/19 0810  . traZODone (DESYREL) tablet 50 mg  50 mg Oral QHS PRN Antonieta Pert, MD   50 mg at 01/21/19 2047  . venlafaxine XR (EFFEXOR-XR) 24 hr capsule 75 mg  75 mg Oral Q breakfast Cobos, Rockey Situ, MD   75 mg at 01/22/19 2778    Lab Results:  Results for orders placed or performed during the hospital encounter of 01/20/19 (from the past 48 hour(s))  Hemoglobin A1c     Status: None   Collection Time: 01/21/19  6:18 AM  Result Value Ref Range   Hgb A1c MFr Bld 4.9 4.8 - 5.6 %    Comment: (NOTE) Pre diabetes:          5.7%-6.4% Diabetes:              >6.4% Glycemic control for   <7.0% adults with diabetes    Mean Plasma Glucose 93.93 mg/dL    Comment: Performed at [facility], [address] [place], [place] [address]  Lipid panel     Status: None   Collection Time: 01/21/19  6:18 AM  Result Value Ref Range   Cholesterol 189 0 - 200 mg/dL   Triglycerides 82 <361 mg/dL   HDL 443 >15 mg/dL   Total CHOL/HDL Ratio 1.5 RATIO   VLDL 16 0 - 40 mg/dL   LDL Cholesterol 49 0 - 99 mg/dL    Comment:        Total Cholesterol/HDL:CHD Risk Coronary Heart Disease Risk Table                     Men    Women  1/2  Average Risk   3.4   3.3  Average Risk       5.0   4.4  2 X Average Risk   9.6   7.1  3 X Average Risk  23.4   11.0        Use the calculated Patient Ratio above and the CHD Risk Table to determine the patient's CHD Risk.        ATP III CLASSIFICATION (LDL):  <100     mg/dL   Optimal  [id number]  mg/dL   Near or Above                    Optimal  130-159  mg/dL   Borderline  [id number]  mg/dL   High  >725>190     mg/dL   Very High Performed at [facility], [address] [place], [place] [address]   TSH     Status: None   Collection Time: 01/21/19  6:18 AM  Result Value Ref Range   TSH 4.057 0.350 - 4.500 uIU/mL    Comment: Performed by a 3rd Generation assay with a functional sensitivity of <=0.01 uIU/mL. Performed at [facility], [address] [place], [place] [address]   Protime-INR     Status: None   Collection Time: 01/21/19  6:18 AM  Result Value Ref Range   Prothrombin Time 13.9 11.4 - 15.2 seconds   INR 1.1 0.8 - 1.2    Comment: (NOTE) INR goal varies based on device and disease states. Performed at [facility], [address] [place], [place] [address]     Blood Alcohol level:  Lab Results  Component Value Date   ETH [facility](HH) 01/19/2019   ETH 474 (HH) 09/12/2018    Metabolic Disorder Labs: Lab Results  Component Value Date   HGBA1C 4.9 01/21/2019   MPG 93.93 01/21/2019   No results found for: PROLACTIN Lab Results  Component Value Date   CHOL 189 01/21/2019   TRIG 82 01/21/2019   HDL 124 01/21/2019   CHOLHDL 1.5 01/21/2019   VLDL 16 01/21/2019   LDLCALC 49 01/21/2019    Physical Findings: AIMS: Facial and Oral Movements Muscles of Facial Expression: None, normal Lips and Perioral Area: None, normal Jaw: None, normal Tongue: None, normal,Extremity Movements Upper (arms, wrists, hands, fingers): None, normal Lower (legs, knees, ankles, toes): None, normal, Trunk Movements Neck,  shoulders, hips: None, normal, Overall Severity Severity of abnormal movements (highest score from questions above): None, normal Incapacitation due to abnormal movements: None, normal Patient's awareness of abnormal movements (rate only patient's report): No Awareness, Dental Status Current problems with teeth and/or dentures?: No Does patient usually wear dentures?: No  CIWA:  CIWA-Ar Total: 1 COWS:  COWS Total Score: 2  Musculoskeletal: Strength & Muscle Tone: within normal limits Gait & Station: normal Patient leans: N/A  Psychiatric Specialty Exam: Physical Exam  Nursing note and vitals reviewed. Constitutional: She is oriented to person, place, and time. She appears well-developed and well-nourished.  HENT:  Head: Normocephalic and atraumatic.  Respiratory: Effort normal.  Neurological: She is alert and oriented to person, place, and time.    ROS  Blood pressure 119/88, pulse 81, temperature 98.1 F (36.7 C), temperature source Oral, resp. rate 16, height 5\' 5"  (1.651 m), weight 62.1 kg, SpO2 100 %.Body mass index is 22.8 kg/m.  General Appearance: Casual  Eye Contact:  Fair  Speech:  Normal Rate  Volume:  Normal  Mood:  Anxious and Depressed  Affect:  Congruent  Thought Process:  Coherent and Descriptions of Associations: Intact  Orientation:  Full (Time, Place, and Person)  Thought Content:  Logical  Suicidal Thoughts:  No  Homicidal Thoughts:  No  Memory:  Immediate;   Fair Recent;   Fair Remote;   Fair  Judgement:  Intact  Insight:  Fair  Psychomotor Activity:  Increased  Concentration:  Concentration: Fair and Attention Span: Fair  Recall:  [employer] of Knowledge:  Fair  Language:  Good  Akathisia:  Negative  Handed:  Right  AIMS (if indicated):     Assets:  Desire for Improvement Resilience  ADL's:  Intact  Cognition:  WNL  Sleep:  Number of Hours: 6.75     Treatment Plan Summary: Daily contact with patient to assess and evaluate symptoms and  progress in treatment, Medication management and Plan : Patient is seen and examined.  Patient is a 33 year old female with the above-stated past psychiatric history who is seen in follow-up.   Diagnosis: #1 substance-induced mood disorder, #2 alcohol dependence, #3 history of opiate dependence, #4 cannabis dependence  Patient is seen in follow-up she is more anxious and a little bit more tearful this morning.  She may be having some withdrawal symptoms.  She does not seem to have had any benefit from the increase gabapentin.  She also stated she had not slept well with the 100 of Seroquel, and so I told her we would increase that up to 200 mg p.o. nightly.  I also reminded her that she had hydroxyzine available for anxiety.  No other changes to her medications, and hopefully we can stabilize things in the short run.  1.  Continue Campral 666 mg p.o. 3 times daily with food for alcohol dependence. 2.    Continue gabapentin to 400 mg p.o. 3 times daily for anxiety and chronic pain. 3.  Continue hydroxyzine 25 mg p.o. 3 times daily as needed anxiety. 4.  Continue lorazepam 1 mg p.o. every 6 hours as needed a CIWA greater than 10. 5.  Continue multivitamin 1 tablet p.o. daily for nutritional supplementation. 6.  Increase Seroquel to 200 mg p.o. nightly for mood stability and sleep. 7.  Continue thiamine 100 mg p.o. daily for nutritional supplementation. 8.  Continue trazodone 50 mg p.o. nightly as needed insomnia. 9.  Continue venlafaxine extended release 75 mg p.o. daily for depression and anxiety. 10.  Disposition planning-in progress.  Sharma Covert, MD 01/22/2019, 12:14 PM

## 2019-01-22 NOTE — Progress Notes (Signed)
Pt has been calm and cooperative with care, no unwanted behavior noted or reported, slept through the night, will continue to monitor.

## 2019-01-23 MED ORDER — LORAZEPAM 1 MG PO TABS
1.0000 mg | ORAL_TABLET | Freq: Four times a day (QID) | ORAL | Status: DC | PRN
  Administered 2019-01-24: 1 mg via ORAL
  Filled 2019-01-23: qty 1

## 2019-01-23 MED ORDER — GABAPENTIN 300 MG PO CAPS
600.0000 mg | ORAL_CAPSULE | Freq: Three times a day (TID) | ORAL | Status: DC
  Administered 2019-01-23 – 2019-01-25 (×6): 600 mg via ORAL
  Filled 2019-01-23 (×12): qty 2

## 2019-01-23 MED ORDER — QUETIAPINE FUMARATE 400 MG PO TABS
400.0000 mg | ORAL_TABLET | Freq: Every day | ORAL | Status: DC
  Administered 2019-01-23 – 2019-01-27 (×5): 400 mg via ORAL
  Filled 2019-01-23 (×7): qty 1

## 2019-01-23 MED ORDER — TRAZODONE HCL 100 MG PO TABS
100.0000 mg | ORAL_TABLET | Freq: Every day | ORAL | Status: DC
  Administered 2019-01-23 – 2019-01-27 (×5): 100 mg via ORAL
  Filled 2019-01-23 (×6): qty 1

## 2019-01-23 MED ORDER — LORAZEPAM 1 MG PO TABS
1.0000 mg | ORAL_TABLET | Freq: Three times a day (TID) | ORAL | Status: DC
  Administered 2019-01-23 – 2019-01-24 (×4): 1 mg via ORAL
  Filled 2019-01-23 (×4): qty 1

## 2019-01-23 MED ORDER — LORAZEPAM 1 MG PO TABS: 1.0000 mg | ORAL_TABLET | Freq: Three times a day (TID) | ORAL | Status: DC

## 2019-01-23 MED ORDER — LORAZEPAM 1 MG PO TABS: 1.0000 mg | ORAL_TABLET | ORAL | Status: AC

## 2019-01-23 NOTE — Progress Notes (Signed)
D:  Patient's self inventory sheet, patient has poor sleep, sleep medication not helpful.  Poor appetite, low energy level, poor concentration.  Rated depression 3, hopeless 2, anxiety 9.  Withdrawals, cravings, chilling.  Denied SI. Physical problems, lightheaded, dizzy.  Denied physical pain.  Goal is work on anxiety.  Plasn to talk to MD.  Still not sleeping.  Does have discharge plans.  "Nervous to go home because I live lone and still have anxiety." A:  Medications administered per MD orders.  Emotional support and encouragement given patient.   R:  Patient denied SI and HI, contracts for safety.  Safety maintained with 15 minute checks.

## 2019-01-23 NOTE — Progress Notes (Signed)
   01/23/19 0132  Psych Admission Type (Psych Patients Only)  Admission Status Voluntary  Psychosocial Assessment  Patient Complaints None  Eye Contact Fair  Facial Expression Animated  Affect Appropriate to circumstance  Speech Logical/coherent  Interaction Assertive  Motor Activity Fidgety;Restless  Appearance/Hygiene Unremarkable  Behavior Characteristics Cooperative  Mood Anxious  Thought Process  Coherency WDL  Content WDL  Delusions None reported or observed  Perception WDL  Hallucination None reported or observed  Judgment WDL  Confusion None  Danger to Self  Current suicidal ideation? Denies  Danger to Others  Danger to Others None reported or observed

## 2019-01-23 NOTE — Plan of Care (Signed)
Nurse discussed anxiety, depression and coping skills with patient.  

## 2019-01-23 NOTE — Progress Notes (Signed)
Adult Psychoeducational Group Note  Date:  01/23/2019 Time:  9:32 PM  Group Topic/Focus:  Wrap-Up Group:   The focus of this group is to help patients review their daily goal of treatment and discuss progress on daily workbooks.  Participation Level:  Active  Participation Quality:  Appropriate  Affect:  Appropriate  Cognitive:  Appropriate  Insight: Appropriate  Engagement in Group:  Engaged  Modes of Intervention:  Discussion  Additional Comments:  Patient attended group and participated.   Kia Varnadore W Jahquan Klugh 80/88/1103, 9:32 PM
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
## 2019-01-23 NOTE — Progress Notes (Addendum)
Patient's mother Alison Koch called, phone [phone], who said her daughter was asking about medicine that would stop her nightmares.  Trazadone 100 and Seroquel 400 mg will be given to her tonight.  Mother stated she does not feel that her daughter should be discharged tomorrow.  Daughter feels that way.  Mother would like to talk to SW and MD about patient's discharge.  Both mother and patient feels that patient's medicines should be under control before she is discharged.  Also patient and mother are very concerned about patient's inability to sleep and severe anxiety and nightmares.   Please call patient's mother Friday 11/20 in the morning to discuss patient's discharge status and medications.  Again, both mother and patient do not feel she is ready for discharge.  Also patient's mother plans to call [employer] in the morning about her daughter's hospitalization.  Patient was also educated on medications

## 2019-01-23 NOTE — Progress Notes (Signed)
   01/23/19 2037  Psych Admission Type (Psych Patients Only)  Admission Status Voluntary  Psychosocial Assessment  Patient Complaints Anxiety  Eye Contact Fair  Facial Expression Animated  Affect Appropriate to circumstance  Speech Logical/coherent  Interaction Assertive  Motor Activity Fidgety;Restless  Appearance/Hygiene Unremarkable  Behavior Characteristics Anxious  Mood Anxious  Thought Process  Coherency WDL  Content WDL  Delusions None reported or observed  Perception WDL  Hallucination None reported or observed  Judgment WDL  Confusion None  Danger to Self  Current suicidal ideation? Denies  Danger to Others  Danger to Others None reported or observed  D: Patient in dayroom reports her day has been stressful thinking about her life and past.   A: Medications administered as prescribed. Support and encouragement provided as needed.  R: Patient remains safe on the unit. Will continue to monitor for safety and stability.

## 2019-01-23 NOTE — [facility] Suicide Risk Assessment (Cosign Needed)
[facility] INPATIENT:  Family/Significant Other Suicide Prevention Education  Suicide Prevention Education:  Education Completed; with mother, Tresia Revolorio [phone] has been identified by the patient as the family member/significant other with whom the patient will be residing, and identified as the person(s) who will aid the patient in the event of a mental health crisis (suicidal ideations/suicide attempt).  With written consent from the patient, the family member/significant other has been provided the following suicide prevention education, prior to the and/or following the discharge of the patient.  The suicide prevention education provided includes the following:  Suicide risk factors  Suicide prevention and interventions  National Suicide Hotline telephone number  [facility] assessment telephone number  [facility] Emergency Assistance [place] and/or Residential Mobile Crisis Unit telephone number  Request made of family/significant other to:  Remove weapons (e.g., guns, rifles, knives), all items previously/currently identified as safety concern.    Remove drugs/medications (over-the-counter, prescriptions, illicit drugs), all items previously/currently identified as a safety concern.  The family member/significant other verbalizes understanding of the suicide prevention education information provided.  The family member/significant other agrees to remove the items of safety concern listed above.  Pt's mother has concerns about pt. She does not think Pt is ready to be discharged due to the lack of sleep and high anxiety levels. Pt's mother mentioned that if pt does not sleep, she can go into a psychosis and pt's mother is very worried about her. Pt's mother also wants pt's medications to be set and working before pt is discharged. Pt's mother does not think there are any guns or weapons in the home.   Billey Chang 01/23/2019, 3:19 PM

## 2019-01-24 MED ORDER — PRAZOSIN HCL 1 MG PO CAPS
1.0000 mg | ORAL_CAPSULE | Freq: Every day | ORAL | Status: DC
  Administered 2019-01-24 – 2019-01-30 (×7): 1 mg via ORAL
  Filled 2019-01-24 (×9): qty 1

## 2019-01-24 MED ORDER — LORAZEPAM 1 MG PO TABS
1.0000 mg | ORAL_TABLET | Freq: Three times a day (TID) | ORAL | Status: DC | PRN
  Administered 2019-01-25: 1 mg via ORAL
  Filled 2019-01-24: qty 1

## 2019-01-24 MED ORDER — HYDROXYZINE HCL 50 MG PO TABS
50.0000 mg | ORAL_TABLET | Freq: Three times a day (TID) | ORAL | Status: DC | PRN
  Administered 2019-01-24 – 2019-01-30 (×18): 50 mg via ORAL
  Filled 2019-01-24 (×18): qty 1

## 2019-01-24 MED ORDER — ACAMPROSATE CALCIUM 333 MG PO TBEC
666.0000 mg | DELAYED_RELEASE_TABLET | Freq: Two times a day (BID) | ORAL | Status: DC
  Administered 2019-01-24 – 2019-01-27 (×7): 666 mg via ORAL
  Filled 2019-01-24 (×11): qty 2

## 2019-01-24 MED ORDER — ACAMPROSATE CALCIUM 333 MG PO TBEC
666.0000 mg | DELAYED_RELEASE_TABLET | Freq: Three times a day (TID) | ORAL | Status: DC
  Filled 2019-01-24 (×5): qty 2

## 2019-01-24 NOTE — Progress Notes (Signed)
Patient ID: Alaine Baker, female   DOB: 05/14/1985, 32 y.o.   MRN: [id number]   [place] NOVEL CORONAVIRUS (COVID-19) DAILY CHECK-OFF SYMPTOMS - answer yes or no to each - every day NO YES  Have you had a fever in the past 24 hours?  . Fever (Temp > 37.80C / 100F) X   Have you had any of these symptoms in the past 24 hours? . New Cough .  Sore Throat  .  Shortness of Breath .  Difficulty Breathing .  Unexplained Body Aches   X   Have you had any one of these symptoms in the past 24 hours not related to allergies?   . Runny Nose .  Nasal Congestion .  Sneezing   X   If you have had runny nose, nasal congestion, sneezing in the past 24 hours, has it worsened?  X   EXPOSURES - check yes or no X   Have you traveled outside the state in the past 14 days?  X   Have you been in contact with someone with a confirmed diagnosis of COVID-19 or PUI in the past 14 days without wearing appropriate PPE?  X   Have you been living in the same home as a person with confirmed diagnosis of COVID-19 or a PUI (household contact)?    X   Have you been diagnosed with COVID-19?    X              What to do next: Answered NO to all: Answered YES to anything:   Proceed with unit schedule Follow the [facility] Inpatient Flowsheet.   

## 2019-01-24 NOTE — Progress Notes (Signed)
Patient ID: Alison Koch, female   DOB: 08-11-1985, 33 y.o.   MRN: [id number]   D: Patient pleasant and smiling on approach but reports that her mood has gotten worse not better since admission. Complaining of a lot of anxiety today. No active SI.  A: Staff will monitor on q 15 minute checks, follow treatment plan, and give medications as ordered. R: Cooperative on the unit

## 2019-01-24 NOTE — Plan of Care (Signed)
  Problem: Activity: Goal: Interest or engagement in activities will improve Outcome: Progressing   Problem: Coping: Goal: Ability to verbalize frustrations and anger appropriately will improve Outcome: Progressing Goal: Ability to demonstrate self-control will improve Outcome: Progressing   Problem: Safety: Goal: Periods of time without injury will increase Outcome: Progressing   D: Pt alert and oriented on the unit. Pt engaging with RN staff and other pts and denies SI/HI, A/VH. Pt was in the day room for most of the day and participated during unit groups and activities. Pt is cooperative on the unit. A: Education, support and encouragement provided, q15 minute safety checks remain in effect. Medications administered per MD orders. R: No reactions/side effects to medicine noted. Pt denies any concerns at this time, and verbally contracts for safety. Pt ambulating on the unit with no issues. Pt remains safe on and off the unit.

## 2019-01-24 NOTE — [facility] Counselor (Signed)
CSW met with patient at bedside to discuss treatment and discharge planning.   Patient was somewhat irritable, she attributes this to lack of sleep and a negative interaction on the unit with providers. Patient reports that she is still not sleeping through the night, and when she does sleep, she has vivid nightmares that involve prior traumas. Patient reports she is expecting to start an additional medication this evening that should help with the nightmares. CSW provided empathetic listening and normalized patient's frustration.   CSW discussed residential substance use treatment referrals with patient a second time. Patient declines residential treatment referrals, she reports she is still looking forward to starting PHP at discharge. Patient expressed concerns for not having her medications properly adjusted prior to Sunday, in order to start PHP on Monday (11/23). CSW re-assured patient that PHP can be rescheduled if patient requires additional inpatient days for medication adjustment.  Stephanie Acre, MSW, [place] Social Worker [facility] Adult Unit  [phone]

## 2019-01-24 NOTE — Progress Notes (Signed)
Recreation Therapy Notes  Date:  11.20.20 Time: 0930 Location: [address] Dayroom  Group Topic: Stress Management  Goal Area(s) Addresses:  Patient will identify positive stress management techniques. Patient will identify benefits of using stress management post d/c.  Behavioral Response:  Engaged  Intervention: Stress Management  Activity: Guided Imagery.  LRT read a script that focused on relaxing by the peaceful waves at the beach.  Patients were to listen and following along as script was read to engage in the activity.   Education:  Stress Management, Discharge Planning.   Education Outcome: Acknowledges Education  Clinical Observations/Feedback:  Pt attended and participated in group activity.     Victorino Sparrow, LRT/CTRS         Astra, Gregg A 01/24/2019 11:09 AM

## 2019-01-24 NOTE — Progress Notes (Addendum)
[facility] MD Progress Note  01/24/2019 12:42 PM Alison Koch  MRN:  [id number]  Subjective: Alison Koch reports, "I'm having a bad day today. I'm having bad anxiety, bad insomnia. I did not sleep at all last night, have not been sleeping well prior to my admission here. I'm having bad night terrors at night. My mind is racing non-stop. The doctor was going to put me on something for the nightmares, but, I have not seen it yet. My medicines are not right as I see it. I do need that medicine to help me sleep better at night. I never in my life time believe that I could get addicted to alcohol, but here I'm. I was a drug addict. When I stopped using drugs, I turned to alcohol as a self-medication to deal with the cravings & associated anxiety. I'm interested in the IOP after discharge".  Objective: Patient is a 33 year old female who presented to the behavioral health hospital as a walk-in on 01/19/2019 accompanied by [facility] department for suicidal ideation. Patient stated at that point that she had gotten into an argument with her mother and boyfriend and just stated that she "wanted to die". She was admitted to the hospital for evaluation and stabilization. 01-24-19: Patient is seen, chart reviewed. The chart findings discussed with the treatment team. She presents alert, oriented & aware of situation. She is making good eye contact & verbally responsive. She is endorsing high anxiety levels, racing thoughts & restlessness. She continues to seek medications after medication for her symptoms. She is saying that her medications are not right or she will not be feeling so bad. She is complaining that she lives by herself & if she gets discharged & her anxiety continues as it is now, she will be heading to the liquor store & getting herself some liquor. She continues to endorse high symptoms of depression & anxiety. Rates depression #9 & anxiety #10. She denies any SIHI, AVH, delusional thoughts or paranoia. She is  asking for Campral for alcohol cravings. See the treatment plan for the adjustments made on her medications.  Principal Problem: Alcohol dependence with alcohol-induced mood disorder (HCC)  Diagnosis: Principal Problem:   Alcohol dependence with alcohol-induced mood disorder (HCC) Active Problems:   Severe recurrent major depression with psychotic features ([place])  Total Time spent with patient: 15 minutes  Past Psychiatric History: See admission H&P  Past Medical History:  Past Medical History:  Diagnosis Date  . Lupus ([place])    History reviewed. No pertinent surgical history.  Family History: History reviewed. No pertinent family history.  Family Psychiatric  History: See admission H&P  Social History:  Social History   Substance and Sexual Activity  Alcohol Use None     Social History   Substance and Sexual Activity  Drug Use Not on file    Social History   Socioeconomic History  . Marital status: Single    Spouse name: Not on file  . Number of children: Not on file  . Years of education: Not on file  . Highest education level: Not on file  Occupational History  . Not on file  Social Needs  . Financial resource strain: Not on file  . Food insecurity    Worry: Not on file    Inability: Not on file  . Transportation needs    Medical: Not on file    Non-medical: Not on file  Tobacco Use  . Smoking status: Not on file  Substance and Sexual Activity  .  Alcohol use: Not on file  . Drug use: Not on file  . Sexual activity: Not on file  Lifestyle  . Physical activity    Days per week: Not on file    Minutes per session: Not on file  . Stress: Not on file  Relationships  . Social Musician on phone: Not on file    Gets together: Not on file    Attends religious service: Not on file    Active member of club or organization: Not on file    Attends meetings of clubs or organizations: Not on file    Relationship status: Not on file  Other Topics  Concern  . Not on file  Social History Narrative  . Not on file   Additional Social History:   Sleep: Fair  Appetite:  Fair  Current Medications: Current Facility-Administered Medications  Medication Dose Route Frequency Provider Last Rate Last Dose  . alum & mag hydroxide-simeth (MAALOX/MYLANTA) 200-200-20 MG/5ML suspension 30 mL  30 mL Oral Q4H PRN Nira Conn A, NP      . feeding supplement (BOOST / RESOURCE BREEZE) liquid 1 Container  1 Container Oral BID BM Cobos, Rockey Situ, MD   1 Container at 01/23/19 1325  . gabapentin (NEURONTIN) capsule 600 mg  600 mg Oral TID Antonieta Pert, MD   600 mg at 01/24/19 1221  . hydrOXYzine (ATARAX/VISTARIL) tablet 50 mg  50 mg Oral TID PRN Armandina Stammer I, NP      . LORazepam (ATIVAN) tablet 1 mg  1 mg Oral Q6H PRN Antonieta Pert, MD      . LORazepam (ATIVAN) tablet 1 mg  1 mg Oral TID PRN Armandina Stammer I, NP      . magnesium hydroxide (MILK OF MAGNESIA) suspension 30 mL  30 mL Oral Daily PRN Nira Conn A, NP   30 mL at 01/24/19 0855  . multivitamin with minerals tablet 1 tablet  1 tablet Oral Daily Nira Conn A, NP   1 tablet at 01/24/19 0744  . nicotine (NICODERM CQ - dosed in mg/24 hours) patch 21 mg  21 mg Transdermal Daily Antonieta Pert, MD   21 mg at 01/24/19 0744  . prazosin (MINIPRESS) capsule 1 mg  1 mg Oral QHS Nwoko, Agnes I, NP      . QUEtiapine (SEROQUEL) tablet 400 mg  400 mg Oral QHS Antonieta Pert, MD   400 mg at 01/23/19 2134  . thiamine (VITAMIN B-1) tablet 100 mg  100 mg Oral Daily Nira Conn A, NP   100 mg at 01/24/19 0744  . traZODone (DESYREL) tablet 100 mg  100 mg Oral QHS Antonieta Pert, MD   100 mg at 01/23/19 2134  . venlafaxine XR (EFFEXOR-XR) 24 hr capsule 75 mg  75 mg Oral Q breakfast Cobos, Rockey Situ, MD   75 mg at 01/24/19 0744   Lab Results: No results found for this or any previous visit (from the past 48 hour(s)).  Blood Alcohol level:  Lab Results  Component Value Date   ETH 442  (HH) 01/19/2019   ETH 474 (HH) 09/12/2018   Metabolic Disorder Labs: Lab Results  Component Value Date   HGBA1C 4.9 01/21/2019   MPG 93.93 01/21/2019   No results found for: PROLACTIN Lab Results  Component Value Date   CHOL 189 01/21/2019   TRIG 82 01/21/2019   HDL 124 01/21/2019   CHOLHDL 1.5 01/21/2019   VLDL 16 01/21/2019  LDLCALC 49 01/21/2019   Physical Findings: AIMS: Facial and Oral Movements Muscles of Facial Expression: None, normal Lips and Perioral Area: None, normal Jaw: None, normal Tongue: None, normal,Extremity Movements Upper (arms, wrists, hands, fingers): None, normal Lower (legs, knees, ankles, toes): None, normal, Trunk Movements Neck, shoulders, hips: None, normal, Overall Severity Severity of abnormal movements (highest score from questions above): None, normal Incapacitation due to abnormal movements: None, normal Patient's awareness of abnormal movements (rate only patient's report): No Awareness, Dental Status Current problems with teeth and/or dentures?: No Does patient usually wear dentures?: No  CIWA:  CIWA-Ar Total: 2 COWS:  COWS Total Score: 2  Musculoskeletal: Strength & Muscle Tone: within normal limits Gait & Station: normal Patient leans: N/A  Psychiatric Specialty Exam: Physical Exam  Nursing note and vitals reviewed. Constitutional: She is oriented to person, place, and time. She appears well-developed and well-nourished.  HENT:  Head: Normocephalic and atraumatic.  Respiratory: Effort normal.  Neurological: She is alert and oriented to person, place, and time.    Review of Systems  Constitutional: Negative for chills and fever.  Respiratory: Negative for cough, shortness of breath and wheezing.   Cardiovascular: Negative for chest pain and palpitations.  Gastrointestinal: Negative for abdominal pain, heartburn, nausea and vomiting.  Neurological: Negative for dizziness and headaches.  Psychiatric/Behavioral: Positive for  depression and substance abuse (Hx. alcoholism, chronic). Negative for hallucinations, memory loss and suicidal ideas. The patient is nervous/anxious and has insomnia.     Blood pressure (!) 87/67, pulse (!) 114, temperature 98.1 F (36.7 C), temperature source Oral, resp. rate 16, height 5\' 5"  (1.651 m), weight 62.1 kg, SpO2 98 %.Body mass index is 22.8 kg/m.  General Appearance: Disheveled  Eye Contact:  Fair  Speech:  Normal Rate  Volume:  Increased  Mood:  Anxious and Depressed  Affect:  Congruent  Thought Process:  Coherent and Descriptions of Associations: Intact  Orientation:  Full (Time, Place, and Person)  Thought Content:  Logical  Suicidal Thoughts:  No  Homicidal Thoughts:  No  Memory:  Immediate;   Fair Recent;   Fair Remote;   Fair  Judgement:  Impaired  Insight:  Fair  Psychomotor Activity:  Increased  Concentration:  Concentration: Fair and Attention Span: Fair  Recall:  of Knowledge:  Fair  Language:  Fair  Akathisia:  Negative  Handed:  Right  AIMS (if indicated):     Assets:  Desire for Improvement Resilience  ADL's:  Intact  Cognition:  WNL  Sleep:  Number of Hours: 6.75   Treatment Plan Summary: Daily contact with patient to assess and evaluate symptoms and progress in treatment and Medication management.  -Continue inpatient hospitalization.  -Will continue today 01/24/2019 plan as below except where it is noted.  Diagnosis: #1 substance-induced mood disorder,  #2 alcohol dependence,  #3 alcohol withdrawal syndrome,  #4 history of opiate dependence,  #5 cannabis dependence  1.  Re-instated Campral 666 mg po tid for alcoholism. 2. Continue gabapentin to 600 mg p.o. 3 times daily for anxiety and chronic pain. 3. Continue hydroxyzine 25 mg p.o. 3 times daily as needed anxiety. 4.  Changed lorazepam 1 mg from 3 times daily to tid prn for anxiety. 5. Continue multivitamin 1 tablet p.o. daily for nutritional supplementation. 6.  Continue Seroquel 400 mg p.o. nightly for mood stability & sleep. 7. Continue thiamine 100 mg p.o. daily for nutritional supplementation. 8. Continue trazodone 100 mg p.o. nightly standing for insomnia. 9. Continue venlafaxine  extended release 75 mg p.o. daily for depression and anxiety. 10. Initiated Minipress 1 mg po Q hs for nightmares. 11. Disposition planning-in progress.  Armandina StammerAgnes Nwoko, NP, PMHNP, FNP-BC 01/24/2019, 12:42 PMPatient ID: Alison DuralLindsay Koch, female   DOB: 03-31-1985, 33 y.o.   MRN: [id number]

## 2019-01-25 MED ORDER — VENLAFAXINE HCL ER 37.5 MG PO CP24
112.5000 mg | ORAL_CAPSULE | Freq: Every day | ORAL | Status: DC
  Administered 2019-01-25 – 2019-01-30 (×6): 112.5 mg via ORAL
  Filled 2019-01-25 (×8): qty 3

## 2019-01-25 MED ORDER — DOCUSATE SODIUM 100 MG PO CAPS
200.0000 mg | ORAL_CAPSULE | Freq: Every day | ORAL | Status: DC
  Administered 2019-01-25 – 2019-01-28 (×4): 200 mg via ORAL
  Filled 2019-01-25 (×13): qty 2

## 2019-01-25 MED ORDER — GABAPENTIN 800 MG PO TABS
800.0000 mg | ORAL_TABLET | Freq: Three times a day (TID) | ORAL | Status: DC
  Administered 2019-01-25 – 2019-02-03 (×29): 800 mg via ORAL
  Filled 2019-01-25 (×34): qty 1

## 2019-01-25 MED ORDER — NICOTINE 14 MG/24HR TD PT24
14.0000 mg | MEDICATED_PATCH | Freq: Every day | TRANSDERMAL | Status: DC
  Administered 2019-01-26 – 2019-01-27 (×2): 14 mg via TRANSDERMAL
  Filled 2019-01-25 (×4): qty 1

## 2019-01-25 MED ORDER — LORAZEPAM 0.5 MG PO TABS
0.5000 mg | ORAL_TABLET | Freq: Three times a day (TID) | ORAL | Status: DC | PRN
  Administered 2019-01-25 – 2019-01-26 (×3): 0.5 mg via ORAL
  Filled 2019-01-25 (×4): qty 1

## 2019-01-25 MED ORDER — DOCUSATE CALCIUM 240 MG PO CAPS: 240.0000 mg | ORAL_CAPSULE | Freq: Every day | ORAL | Status: DC

## 2019-01-25 NOTE — Progress Notes (Signed)
[place] NOVEL CORONAVIRUS (COVID-19) DAILY CHECK-OFF SYMPTOMS - answer yes or no to each - every day NO YES  Have you had a fever in the past 24 hours?  . Fever (Temp > 37.80C / 100F) X   Have you had any of these symptoms in the past 24 hours? . New Cough .  Sore Throat  .  Shortness of Breath .  Difficulty Breathing .  Unexplained Body Aches   X   Have you had any one of these symptoms in the past 24 hours not related to allergies?   . Runny Nose .  Nasal Congestion .  Sneezing   X   If you have had runny nose, nasal congestion, sneezing in the past 24 hours, has it worsened?  X   EXPOSURES - check yes or no X   Have you traveled outside the state in the past 14 days?  X   Have you been in contact with someone with a confirmed diagnosis of COVID-19 or PUI in the past 14 days without wearing appropriate PPE?  X   Have you been living in the same home as a person with confirmed diagnosis of COVID-19 or a PUI (household contact)?    X   Have you been diagnosed with COVID-19?    X              What to do next: Answered NO to all: Answered YES to anything:   Proceed with unit schedule Follow the [facility] Inpatient Flowsheet.   

## 2019-01-25 NOTE — Progress Notes (Addendum)
   01/25/19 0900  Psych Admission Type (Psych Patients Only)  Admission Status Voluntary  Psychosocial Assessment  Patient Complaints Anxiety  Eye Contact Fair  Facial Expression Animated  Affect Appropriate to circumstance  Speech Logical/coherent  Interaction Assertive  Motor Activity Other (Comment) (WNL)  Appearance/Hygiene Unremarkable  Behavior Characteristics Cooperative  Mood Depressed;Anxious  Thought Process  Coherency WDL  Content WDL  Delusions WDL  Perception WDL  Hallucination None reported or observed  Judgment WDL  Confusion WDL  Danger to Self  Current suicidal ideation? Denies  Danger to Others  Danger to Others None reported or observed

## 2019-01-25 NOTE — Progress Notes (Signed)
[facility] Group Notes:  (Nursing/MHT/Case Management/Adjunct)  Date:  01/25/2019  Time:  2030  Type of Therapy:  wrap up group  Participation Level:  Active  Participation Quality:  Appropriate, Attentive, Sharing and Supportive  Affect:  Anxious  Cognitive:  Appropriate  Insight:  Improving  Engagement in Group:  Engaged  Modes of Intervention:  Clarification, Education and Support  Summary of Progress/Problems:  Alison Koch, Alison Koch 01/25/2019, 9:35 PM

## 2019-01-25 NOTE — [facility] Group Notes (Signed)
LCSW Group Therapy Note  01/25/2019    10:00-11:00am   Type of Therapy and Topic:  Group Therapy: Communication (Passive, Assertive, and Aggressive)  Participation Level:  Minimal   Description of Group:   In this group, patients learned about the diffferences between passive, assertive, and aggressive communication.  Examples were given and patients were asked to explore what their typical style of communicating might be.  The way in which communication style is linked to anger was also discussed.  Patients talked about their life journeys of getting to know themselves.  CSW briefly provided education about the Dwaine Gale stages of development and that looking at one's purpose in life is a natural human task.  Therapeutic Goals: 1. Patients will identify their typical communication style 2. Patients will discuss how their communication style works for them and against them. 3. Patients will explore the link between their communication style and their anger 4. Patients will learn that communication can always be improved and can benefit not only the relationship they have with others, but also the relationship they have with themselves.  Summary of Patient Progress:  The patient shared that she typically uses passive communication.  She will bottle up her actual feelings until she explodes, she said.  The patient did leave group early and did not return.  Therapeutic Modalities:   Processing Education  Maretta Los  .

## 2019-01-25 NOTE — Progress Notes (Signed)
[facility] MD Progress Note  01/25/2019 11:25 AM Alison Koch  MRN:  [id number] Subjective:  Patient is a 33 year old female who presented to the behavioral health hospital as a walk-in on 01/19/2019 accompanied by [facility] department for suicidal ideation. Patient stated at that point that she had gotten into an argument with her mother and boyfriend and just stated that she "wanted to die". She was admitted to the hospital for evaluation and stabilization.  Objective: Patient is seen and examined.  Patient is a 33 year old female with the above-stated past psychiatric history who is seen in follow-up.  She is perhaps slightly better today.  She still significantly anxious.  She stated she slept better last night, and did not have nightmares or flashbacks.  She still is psychomotor agitated.  She is at least considering the potential of a residential substance abuse treatment program.  Her preference right now is to do the intensive outpatient program.  I increased her Effexor XR at this morning to 112.5 mg from the 75 mg dose she was previously on.  This was done to control her anxiety.  She stated she did talk with her mother last night, and the mother is going to come visit her tonight.  Otherwise she is essentially unchanged.  She denied suicidal or homicidal ideation.  There is no evidence of any psychosis.  She remains anxious.  Principal Problem: Alcohol dependence with alcohol-induced mood disorder (HCC) Diagnosis: Principal Problem:   Alcohol dependence with alcohol-induced mood disorder (HCC) Active Problems:   Severe recurrent major depression with psychotic features (HCC)  Total Time spent with patient: 20 minutes  Past Psychiatric History: See admission H&P  Past Medical History:  Past Medical History:  Diagnosis Date  . Lupus (HCC)    History reviewed. No pertinent surgical history. Family History: History reviewed. No pertinent family history. Family Psychiatric  History: See  admission H&P Social History:  Social History   Substance and Sexual Activity  Alcohol Use None     Social History   Substance and Sexual Activity  Drug Use Not on file    Social History   Socioeconomic History  . Marital status: Single    Spouse name: Not on file  . Number of children: Not on file  . Years of education: Not on file  . Highest education level: Not on file  Occupational History  . Not on file  Social Needs  . Financial resource strain: Not on file  . Food insecurity    Worry: Not on file    Inability: Not on file  . Transportation needs    Medical: Not on file    Non-medical: Not on file  Tobacco Use  . Smoking status: Not on file  Substance and Sexual Activity  . Alcohol use: Not on file  . Drug use: Not on file  . Sexual activity: Not on file  Lifestyle  . Physical activity    Days per week: Not on file    Minutes per session: Not on file  . Stress: Not on file  Relationships  . Social Musician on phone: Not on file    Gets together: Not on file    Attends religious service: Not on file    Active member of club or organization: Not on file    Attends meetings of clubs or organizations: Not on file    Relationship status: Not on file  Other Topics Concern  . Not on file  Social History Narrative  .  Not on file   Additional Social History:                         Sleep: Fair  Appetite:  Fair  Current Medications: Current Facility-Administered Medications  Medication Dose Route Frequency Provider Last Rate Last Dose  . acamprosate (CAMPRAL) tablet 666 mg  666 mg Oral BID AC Antonieta Pert, MD   666 mg at 01/25/19 1324  . alum & mag hydroxide-simeth (MAALOX/MYLANTA) 200-200-20 MG/5ML suspension 30 mL  30 mL Oral Q4H PRN Nira Conn A, NP      . feeding supplement (BOOST / RESOURCE BREEZE) liquid 1 Container  1 Container Oral BID BM Cobos, Rockey Situ, MD   1 Container at 01/23/19 1325  . gabapentin (NEURONTIN)  capsule 600 mg  600 mg Oral TID Antonieta Pert, MD   600 mg at 01/25/19 0800  . hydrOXYzine (ATARAX/VISTARIL) tablet 50 mg  50 mg Oral TID PRN Armandina Stammer I, NP   50 mg at 01/25/19 0800  . LORazepam (ATIVAN) tablet 1 mg  1 mg Oral Q6H PRN Antonieta Pert, MD   1 mg at 01/24/19 1720  . LORazepam (ATIVAN) tablet 1 mg  1 mg Oral TID PRN Armandina Stammer I, NP   1 mg at 01/25/19 4010  . magnesium hydroxide (MILK OF MAGNESIA) suspension 30 mL  30 mL Oral Daily PRN Nira Conn A, NP   30 mL at 01/25/19 0804  . multivitamin with minerals tablet 1 tablet  1 tablet Oral Daily Nira Conn A, NP   1 tablet at 01/25/19 0801  . nicotine (NICODERM CQ - dosed in mg/24 hours) patch 21 mg  21 mg Transdermal Daily Antonieta Pert, MD   21 mg at 01/25/19 2725  . prazosin (MINIPRESS) capsule 1 mg  1 mg Oral QHS Armandina Stammer I, NP   1 mg at 01/24/19 2116  . QUEtiapine (SEROQUEL) tablet 400 mg  400 mg Oral QHS Antonieta Pert, MD   400 mg at 01/24/19 2116  . thiamine (VITAMIN B-1) tablet 100 mg  100 mg Oral Daily Nira Conn A, NP   100 mg at 01/25/19 0801  . traZODone (DESYREL) tablet 100 mg  100 mg Oral QHS Antonieta Pert, MD   100 mg at 01/24/19 2116  . venlafaxine XR (EFFEXOR-XR) 24 hr capsule 112.5 mg  112.5 mg Oral Q breakfast Antonieta Pert, MD   112.5 mg at 01/25/19 3664    Lab Results: No results found for this or any previous visit (from the past 48 hour(s)).  Blood Alcohol level:  Lab Results  Component Value Date   ETH 442 (HH) 01/19/2019   ETH 474 (HH) 09/12/2018    Metabolic Disorder Labs: Lab Results  Component Value Date   HGBA1C 4.9 01/21/2019   MPG 93.93 01/21/2019   No results found for: PROLACTIN Lab Results  Component Value Date   CHOL 189 01/21/2019   TRIG 82 01/21/2019   HDL 124 01/21/2019   CHOLHDL 1.5 01/21/2019   VLDL 16 01/21/2019   LDLCALC 49 01/21/2019    Physical Findings: AIMS: Facial and Oral Movements Muscles of Facial Expression: None,  normal Lips and Perioral Area: None, normal Jaw: None, normal Tongue: None, normal,Extremity Movements Upper (arms, wrists, hands, fingers): None, normal Lower (legs, knees, ankles, toes): None, normal, Trunk Movements Neck, shoulders, hips: None, normal, Overall Severity Severity of abnormal movements (highest score from questions above): None, normal Incapacitation  due to abnormal movements: None, normal Patient's awareness of abnormal movements (rate only patient's report): No Awareness, Dental Status Current problems with teeth and/or dentures?: No Does patient usually wear dentures?: No  CIWA:  CIWA-Ar Total: 2 COWS:  COWS Total Score: 2  Musculoskeletal: Strength & Muscle Tone: within normal limits Gait & Station: normal Patient leans: N/A  Psychiatric Specialty Exam: Physical Exam  Nursing note and vitals reviewed. Constitutional: She is oriented to person, place, and time. She appears well-developed and well-nourished.  HENT:  Head: Normocephalic and atraumatic.  Respiratory: Effort normal.  Neurological: She is alert and oriented to person, place, and time.    ROS  Blood pressure (!) 89/63, pulse (!) 126, temperature 98.1 F (36.7 C), temperature source Oral, resp. rate 18, height 5\' 5"  (1.651 m), weight 62.1 kg, SpO2 100 %.Body mass index is 22.8 kg/m.  General Appearance: Casual  Eye Contact:  Fair  Speech:  Normal Rate  Volume:  Normal  Mood:  Anxious  Affect:  Congruent  Thought Process:  Coherent and Descriptions of Associations: Intact  Orientation:  Full (Time, Place, and Person)  Thought Content:  Logical  Suicidal Thoughts:  No  Homicidal Thoughts:  No  Memory:  Immediate;   Fair Recent;   Fair Remote;   Fair  Judgement:  Intact  Insight:  Fair  Psychomotor Activity:  Increased  Concentration:  Concentration: Fair and Attention Span: Fair  Recall:  [employer]Fair  Fund of Knowledge:  Fair  Language:  Good  Akathisia:  Negative  Handed:  Right  AIMS (if  indicated):     Assets:  Communication Skills Desire for Improvement Housing Resilience Social Support  ADL's:  Intact  Cognition:  WNL  Sleep:  Number of Hours: 6.75     Treatment Plan Summary: Daily contact with patient to assess and evaluate symptoms and progress in treatment, Medication management and Plan : Patient is seen and examined.  Patient is a 33 year old female with the above-stated past psychiatric history who is seen in follow-up.   Diagnosis: #1 substance-induced mood disorder versus major depressive disorder, severe without psychotic features,, #2 alcohol dependence, #3 alcohol withdrawal syndrome, #4  generalized anxiety disorder, #5 posttraumatic stress disorder, #6 history of opiate dependence,  #7 cannabis dependence  Patient is seen in follow-up.  Her anxiety is somewhat better, and her sleep is slightly better.  She still very anxious with regard to her general situation, her sobriety as well as potentially going to a residential substance abuse treatment program.  Her venlafaxine extended release was increased to 112.5 mg p.o. daily today.  She continues on gabapentin, lorazepam, prazosin, Seroquel, trazodone as well as the Effexor.  I will increase the gabapentin today to 800 mg p.o. 3 times daily in attempt to get better control of her anxiety.  No other changes in her medicines at this point.  Her vital signs are stable, she is afebrile.  No new laboratories. 1.  Increase Neurontin to 800 mg p.o. 3 times daily for anxiety. 2.  Continue hydroxyzine 50 mg p.o. 3 times daily as needed anxiety. 3.  Lorazepam 1 mg p.o. every 6 hours as needed a CIWA greater than 10. 4.  Decrease lorazepam 2.5 mg p.o. 3 times daily as needed anxiety. 5.  Continue prazosin 1 mg p.o. nightly for nightmares and flashbacks. 6.  Decrease nicotine patch to 14 mg transdermally over 24 hours for nicotine dependence. 7.  Continue Seroquel 400 mg p.o. nightly for mood stability and insomnia. 8.  Continue thiamine 100 mg p.o. daily for nutritional supplementation. 9.  Continue Campral 666 mg p.o. twice daily for alcohol dependence. 10.  Continue trazodone 100 mg p.o. nightly for insomnia. 11.  Increase venlafaxine extended release to 112.5 mg p.o. daily for anxiety and depression. 12.  Disposition planning-in progress.    Sharma Covert, MD 01/25/2019, 11:25 AM

## 2019-01-26 MED ORDER — PRAZOSIN HCL 1 MG PO CAPS: 1.0000 mg | ORAL_CAPSULE | Freq: Every day | ORAL | 0 refills | Status: DC

## 2019-01-26 MED ORDER — TRAZODONE HCL 100 MG PO TABS: 100.0000 mg | ORAL_TABLET | Freq: Every day | ORAL | 0 refills | Status: DC

## 2019-01-26 MED ORDER — HYDROXYZINE HCL 50 MG PO TABS: 50.0000 mg | ORAL_TABLET | Freq: Three times a day (TID) | ORAL | 0 refills | Status: DC | PRN

## 2019-01-26 MED ORDER — VITAMIN B-1 100 MG PO TABS: 100.0000 mg | ORAL_TABLET | Freq: Every day | ORAL | 0 refills | Status: DC

## 2019-01-26 MED ORDER — GABAPENTIN 800 MG PO TABS: 800.0000 mg | ORAL_TABLET | Freq: Three times a day (TID) | ORAL | 0 refills | Status: DC

## 2019-01-26 MED ORDER — QUETIAPINE FUMARATE 400 MG PO TABS: 400.0000 mg | ORAL_TABLET | Freq: Every day | ORAL | 0 refills | Status: DC

## 2019-01-26 MED ORDER — VENLAFAXINE HCL ER 37.5 MG PO CP24: 112.5000 mg | ORAL_CAPSULE | Freq: Every day | ORAL | 0 refills | Status: DC

## 2019-01-26 MED ORDER — NICOTINE 14 MG/24HR TD PT24: 14.0000 mg | MEDICATED_PATCH | Freq: Every day | TRANSDERMAL | 0 refills | Status: DC

## 2019-01-26 MED ORDER — ACAMPROSATE CALCIUM 333 MG PO TBEC: 666.0000 mg | DELAYED_RELEASE_TABLET | Freq: Two times a day (BID) | ORAL | 0 refills | Status: DC

## 2019-01-26 MED ORDER — DOCUSATE SODIUM 100 MG PO CAPS: 200.0000 mg | ORAL_CAPSULE | Freq: Every day | ORAL | 0 refills | Status: DC

## 2019-01-26 NOTE — Progress Notes (Signed)
Pt's mother Seerat Peaden) called requesting to speak with the Dr before pt is discharged tomorrow. Pt's mother expresses concern that pt is not ready for discharge. Contact # is  [phone]

## 2019-01-26 NOTE — Progress Notes (Signed)
[place] NOVEL CORONAVIRUS (COVID-19) DAILY CHECK-OFF SYMPTOMS - answer yes or no to each - every day NO YES  Have you had a fever in the past 24 hours?  . Fever (Temp > 37.80C / 100F) X   Have you had any of these symptoms in the past 24 hours? . New Cough .  Sore Throat  .  Shortness of Breath .  Difficulty Breathing .  Unexplained Body Aches   X   Have you had any one of these symptoms in the past 24 hours not related to allergies?   . Runny Nose .  Nasal Congestion .  Sneezing   X   If you have had runny nose, nasal congestion, sneezing in the past 24 hours, has it worsened?  X   EXPOSURES - check yes or no X   Have you traveled outside the state in the past 14 days?  X   Have you been in contact with someone with a confirmed diagnosis of COVID-19 or PUI in the past 14 days without wearing appropriate PPE?  X   Have you been living in the same home as a person with confirmed diagnosis of COVID-19 or a PUI (household contact)?    X   Have you been diagnosed with COVID-19?    X              What to do next: Answered NO to all: Answered YES to anything:   Proceed with unit schedule Follow the [facility] Inpatient Flowsheet.   

## 2019-01-26 NOTE — [facility] Suicide Risk Assessment (Signed)
[facility] Discharge Suicide Risk Assessment   Principal Problem: Alcohol dependence with alcohol-induced mood disorder ([place]) Discharge Diagnoses: Principal Problem:   Alcohol dependence with alcohol-induced mood disorder ([place]) Active Problems:   Severe recurrent major depression with psychotic features ([place])   Total Time spent with patient: 20 minutes  Musculoskeletal: Strength & Muscle Tone: within normal limits Gait & Station: normal Patient leans: N/A  Psychiatric Specialty Exam: Review of Systems  All other systems reviewed and are negative.   Blood pressure 105/71, pulse (!) 119, temperature 98.6 F (37 C), temperature source Oral, resp. rate 18, height 5\' 5"  (1.651 m), weight 62.1 kg, SpO2 100 %.Body mass index is 22.8 kg/m.  General Appearance: Casual  Eye Contact::  Fair  Speech:  Normal Rate409  Volume:  Normal  Mood:  Anxious  Affect:  Congruent  Thought Process:  Coherent and Descriptions of Associations: Intact  Orientation:  Full (Time, Place, and Person)  Thought Content:  Logical  Suicidal Thoughts:  No  Homicidal Thoughts:  No  Memory:  Immediate;   Fair Recent;   Fair Remote;   Fair  Judgement:  Intact  Insight:  Lacking  Psychomotor Activity:  Normal  Concentration:  Fair  Recall:  [employer] of Knowledge:Good  Language: Good  Akathisia:  Negative  Handed:  Right  AIMS (if indicated):     Assets:  Desire for Improvement Housing Resilience  Sleep:  Number of Hours: 6.5  Cognition: WNL  ADL's:  Intact   Mental Status Per Nursing Assessment::   On Admission:  Suicidal ideation indicated by patient  Demographic Factors:  Caucasian  Loss Factors: NA  Historical Factors: Impulsivity  Risk Reduction Factors:   Positive social support  Continued Clinical Symptoms:  Depression:   Comorbid alcohol abuse/dependence Impulsivity Alcohol/Substance Abuse/Dependencies  Cognitive Features That Contribute To Risk:  Thought constriction (tunnel  vision)    Suicide Risk:  Minimal: No identifiable suicidal ideation.  Patients presenting with no risk factors but with morbid ruminations; may be classified as minimal risk based on the severity of the depressive symptoms  Follow-up [place]. Call on 01/27/2019.   Specialty: Behavioral Health Why: Your initial [facility] appointment is Monday, 01/27/19 at 12:00pm. This appointment will be held via WebEx, so be sure to check you email for the required link at discharge. If you have any questions or concerns, please call the number listed.  Contact information: [place] [id number] [place] [phone]          Plan Of Care/Follow-up recommendations:  Activity:  ad lib  Sharma Covert, MD 01/26/2019, 2:07 PM

## 2019-01-26 NOTE — Progress Notes (Signed)
[facility] MD Progress Note  01/26/2019 1:59 PM Alison Koch  MRN:  [id number] Subjective:  tient is a 33 year old female who presented to the behavioral health hospital as a walk-in on 01/19/2019 accompanied by [facility] department for suicidal ideation. Patient stated at that point that she had gotten into an argument with her mother and boyfriend and just stated that she "wanted to die". She was admitted to the hospital for evaluation and stabilization.  Objective: Patient is seen and examined.  Patient is a 33 year old female with the above-stated past psychiatric history who is seen in follow-up.  She is essentially unchanged from yesterday.  She still mildly anxious.  She admitted that she still continues to sleep well.  She is upset today because the fact her roommate was discharged to home.  "I learn to love her".  She did not discuss any change in her thinking about going to a residential program.  We discussed the fact that she would be discharged tomorrow morning to the IOP program.  She continues to ask for "1 more day", but I told her that 1 more day would just keep her from dealing with her substance issues.  Her vital signs are stable, she is afebrile.  She is mildly tachycardic with a rate of approximately 119.  She slept 6.5 hours last night.  She is not showing any signs or symptoms of active withdrawal.  Previously she expressed a great deal of emotional withdrawal symptoms, but her vital signs were stable throughout.  Principal Problem: Alcohol dependence with alcohol-induced mood disorder ([facility]) Diagnosis: Principal Problem:   Alcohol dependence with alcohol-induced mood disorder ([facility]) Active Problems:   Severe recurrent major depression with psychotic features ([facility])  Total Time spent with patient: 20 minutes  Past Psychiatric History: See admission H&P  Past Medical History:  Past Medical History:  Diagnosis Date  . Lupus ([facility])    History reviewed. No pertinent surgical  history. Family History: History reviewed. No pertinent family history. Family Psychiatric  History: See admission H&P Social History:  Social History   Substance and Sexual Activity  Alcohol Use None     Social History   Substance and Sexual Activity  Drug Use Not on file    Social History   Socioeconomic History  . Marital status: Single    Spouse name: Not on file  . Number of children: Not on file  . Years of education: Not on file  . Highest education level: Not on file  Occupational History  . Not on file  Social Needs  . Financial resource strain: Not on file  . Food insecurity    Worry: Not on file    Inability: Not on file  . Transportation needs    Medical: Not on file    Non-medical: Not on file  Tobacco Use  . Smoking status: Not on file  Substance and Sexual Activity  . Alcohol use: Not on file  . Drug use: Not on file  . Sexual activity: Not on file  Lifestyle  . Physical activity    Days per week: Not on file    Minutes per session: Not on file  . Stress: Not on file  Relationships  . Social Musician on phone: Not on file    Gets together: Not on file    Attends religious service: Not on file    Active member of club or organization: Not on file    Attends meetings of clubs or organizations: Not  on file    Relationship status: Not on file  Other Topics Concern  . Not on file  Social History Narrative  . Not on file   Additional Social History:                         Sleep: Good  Appetite:  Fair  Current Medications: Current Facility-Administered Medications  Medication Dose Route Frequency Provider Last Rate Last Dose  . acamprosate (CAMPRAL) tablet 666 mg  666 mg Oral BID AC Antonieta Pert, MD   666 mg at 01/26/19 0615  . alum & mag hydroxide-simeth (MAALOX/MYLANTA) 200-200-20 MG/5ML suspension 30 mL  30 mL Oral Q4H PRN Nira Conn A, NP      . docusate sodium (COLACE) capsule 200 mg  200 mg Oral Daily  Cobos, Rockey Situ, MD   200 mg at 01/26/19 0805  . feeding supplement (BOOST / RESOURCE BREEZE) liquid 1 Container  1 Container Oral BID BM Cobos, Rockey Situ, MD   1 Container at 01/26/19 1159  . gabapentin (NEURONTIN) tablet 800 mg  800 mg Oral TID Antonieta Pert, MD   800 mg at 01/26/19 1200  . hydrOXYzine (ATARAX/VISTARIL) tablet 50 mg  50 mg Oral TID PRN Armandina Stammer I, NP   50 mg at 01/26/19 0953  . LORazepam (ATIVAN) tablet 0.5 mg  0.5 mg Oral TID PRN Antonieta Pert, MD   0.5 mg at 01/26/19 0806  . LORazepam (ATIVAN) tablet 1 mg  1 mg Oral Q6H PRN Antonieta Pert, MD   1 mg at 01/24/19 1720  . magnesium hydroxide (MILK OF MAGNESIA) suspension 30 mL  30 mL Oral Daily PRN Nira Conn A, NP   30 mL at 01/25/19 0804  . multivitamin with minerals tablet 1 tablet  1 tablet Oral Daily Nira Conn A, NP   1 tablet at 01/26/19 0806  . nicotine (NICODERM CQ - dosed in mg/24 hours) patch 14 mg  14 mg Transdermal Daily Antonieta Pert, MD   14 mg at 01/26/19 1610  . prazosin (MINIPRESS) capsule 1 mg  1 mg Oral QHS Armandina Stammer I, NP   1 mg at 01/25/19 2109  . QUEtiapine (SEROQUEL) tablet 400 mg  400 mg Oral QHS Antonieta Pert, MD   400 mg at 01/25/19 2108  . thiamine (VITAMIN B-1) tablet 100 mg  100 mg Oral Daily Nira Conn A, NP   100 mg at 01/26/19 0806  . traZODone (DESYREL) tablet 100 mg  100 mg Oral QHS Antonieta Pert, MD   100 mg at 01/25/19 2109  . venlafaxine XR (EFFEXOR-XR) 24 hr capsule 112.5 mg  112.5 mg Oral Q breakfast Antonieta Pert, MD   112.5 mg at 01/26/19 9604    Lab Results: No results found for this or any previous visit (from the past 48 hour(s)).  Blood Alcohol level:  Lab Results  Component Value Date   ETH 442 (HH) 01/19/2019   ETH 474 (HH) 09/12/2018    Metabolic Disorder Labs: Lab Results  Component Value Date   HGBA1C 4.9 01/21/2019   MPG 93.93 01/21/2019   No results found for: PROLACTIN Lab Results  Component Value Date   CHOL 189  01/21/2019   TRIG 82 01/21/2019   HDL 124 01/21/2019   CHOLHDL 1.5 01/21/2019   VLDL 16 01/21/2019   LDLCALC 49 01/21/2019    Physical Findings: AIMS: Facial and Oral Movements Muscles of Facial Expression: None,  normal Lips and Perioral Area: None, normal Jaw: None, normal Tongue: None, normal,Extremity Movements Upper (arms, wrists, hands, fingers): None, normal Lower (legs, knees, ankles, toes): None, normal, Trunk Movements Neck, shoulders, hips: None, normal, Overall Severity Severity of abnormal movements (highest score from questions above): None, normal Incapacitation due to abnormal movements: None, normal Patient's awareness of abnormal movements (rate only patient's report): No Awareness, Dental Status Current problems with teeth and/or dentures?: No Does patient usually wear dentures?: No  CIWA:  CIWA-Ar Total: 3 COWS:  COWS Total Score: 2  Musculoskeletal: Strength & Muscle Tone: within normal limits Gait & Station: normal Patient leans: N/A  Psychiatric Specialty Exam: Physical Exam  Nursing note and vitals reviewed. Constitutional: She is oriented to person, place, and time. She appears well-developed and well-nourished.  HENT:  Head: Normocephalic and atraumatic.  Respiratory: Effort normal.  Neurological: She is alert and oriented to person, place, and time.    ROS  Blood pressure 105/71, pulse (!) 119, temperature 98.6 F (37 C), temperature source Oral, resp. rate 18, height 5\' 5"  (1.651 m), weight 62.1 kg, SpO2 100 %.Body mass index is 22.8 kg/m.  General Appearance: Casual  Eye Contact:  Fair  Speech:  Normal Rate  Volume:  Decreased  Mood:  Anxious  Affect:  Congruent  Thought Process:  Coherent and Descriptions of Associations: Intact  Orientation:  Full (Time, Place, and Person)  Thought Content:  Logical  Suicidal Thoughts:  No  Homicidal Thoughts:  No  Memory:  Immediate;   Fair Recent;   Fair Remote;   Fair  Judgement:  Intact   Insight:  Lacking  Psychomotor Activity:  Normal  Concentration:  Concentration: Fair and Attention Span: Fair  Recall:  [employer] of Knowledge:  Good  Language:  Good  Akathisia:  Negative  Handed:  Right  AIMS (if indicated):     Assets:  Desire for Improvement Housing Resilience  ADL's:  Intact  Cognition:  WNL  Sleep:  Number of Hours: 6.5     Treatment Plan Summary: Daily contact with patient to assess and evaluate symptoms and progress in treatment, Medication management and Plan : Patient is seen and examined.  Patient is a 33 year old female with the above-stated past psychiatric history who is seen in follow-up.   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  She is essentially unchanged from yesterday.  Her sleep remains good.  Her vital signs are stable and she is not showing any signs or symptoms of withdrawal.  She keeps waxing and waning with regard to residential treatment versus an intensive outpatient program.  She is just stalling on getting involved with the substance abuse treatment.  I am going to stop the lorazepam today.  No other changes in her medicines.  We will discharge tomorrow morning to be able to get started with the intensive outpatient program tomorrow.  1.  Continue Neurontin to 800 mg p.o. 3 times daily for anxiety. 2.  Continue hydroxyzine 50 mg p.o. 3 times daily as needed anxiety. 3.  Stop lorazepam  4.   Continue prazosin 1 mg p.o. nightly for nightmares and flashbacks. 6.    Continue nicotine patch to 14 mg transdermally over 24 hours for nicotine dependence. 7.  Continue Seroquel 400 mg p.o. nightly for mood stability  and insomnia. 8.  Continue thiamine 100 mg p.o.  daily for nutritional supplementation. 9.  Continue Campral 666 mg p.o. twice daily for alcohol dependence. 10. Continue trazodone 100 mg p.o. nightly for insomnia. 11   Continue venlafaxine extended release to 112.5 mg p.o. daily for anxiety and depression. 12.  Disposition planning-plan DC in a.m. to start IOP for substance issues. Antonieta PertGreg Lawson Clary, MD 01/26/2019, 1:59 PM

## 2019-01-26 NOTE — Progress Notes (Addendum)
   01/26/19 1500  Psych Admission Type (Psych Patients Only)  Admission Status Voluntary  Psychosocial Assessment  Patient Complaints Anxiety  Eye Contact Fair  Facial Expression Anxious  Affect Anxious  Speech Logical/coherent  Interaction Assertive  Motor Activity Other (Comment) (WDL)  Appearance/Hygiene Unremarkable  Behavior Characteristics Cooperative;Anxious  Mood Depressed;Anxious  Thought Process  Coherency WDL  Content WDL  Delusions None reported or observed  Perception WDL  Hallucination None reported or observed  Judgment WDL  Confusion None  Danger to Self  Current suicidal ideation? Denies  Danger to Others  Danger to Others None reported or observed  D. Pt continues to present as very anxious, states, "I'm scared to go home right now because I live alone". Per pt's self inventory, pt rated her depression, hopelessness and anxiety a 11/12/08, respectively.  Pt currently denies SI/HI and AVH A. Labs and vitals monitored. Pt compliant with  medications. Pt supported emotionally and encouraged to express concerns and ask questions.   R. Pt remains safe with 15 minute checks. Will continue POC.

## 2019-01-26 NOTE — Progress Notes (Signed)
D.  Pt pleasant on approach, complaint of anxiety.  Pt was anxious about getting a new roommate. She stated she loved her last roommate and would keep in touch with her, but was worried about how this one would be.  Pt was positive for evening wrap up group, observed appropriately engaged with peers on the unit.  Pt denies SI/HI/AVH at this time.  A.  Support and encouragement offered, medication given as ordered  R.  Pt remains safe on the unit, will continue to monitor.

## 2019-01-26 NOTE — [facility] Group Notes (Signed)
LCSW Group Therapy Note  01/26/2019 1:15pm  Type of Therapy and Topic:  Group Therapy - Relating to Music to Understand Ourselves  Participation Level:  Minimal   Description of Group This process group involved patients listening to a number of songs, then discussing how their emotions and/or lives relate to said songs.  This brought up patient descriptions of their anxiety, lack of confidence in themselves, acceptance of abuse in their lives, and use of substances to self-medicate.  In general, patients agreed that music can be used as a coping tool to express their feelings, have someone to relate to, and realize they are not alone.  Specifically, the songs played were: Dear Insecurity (about not wanting to continue giving anxiety permission to run one's life) Breaking Down (about making the choice not to continue using substances to deal with problems) You're Not The Only One (about everyone having problems) Warrior (about refusing to continue being other people's victim) I Am Enough (about choosing to look for the good in oneself, instead of only the bad)  Therapeutic Goals 1. Patient will listen to the songs and be given the opportunity to talk about how they reacted to each 2. Patient will empathize with each other over the pain shared 3. Patients will be given a message of hope   Summary of Patient Progress:  Alison Koch was present for only the first song, and she talked about her reactions, seemed to be benefiting from the group.  However, she abruptly got up and left prior to the second song beginning and did not return.   Therapeutic Modalities Processing Activity   Alison Los, LCSW 01/26/2019  11:09 AM

## 2019-01-26 NOTE — Progress Notes (Signed)
D.  Spoke with patient 1:1, patient extremely tearful, patient states that she will harm herself if discharged tomorrow.  Pt states she still has considerable suicidal ideation and has a plan to use a knife for this if discharged.  Pt states that she does feel safe here, but not discharging.  Pt did earlier ask the day shift RN, per day shift RN, if anyone has ever hurt themselves badly here.  Pt's mother spoke with day shift RN and per report is extremely concerned for patient's safety and wishes to speak to the doctor tomorrow personally before patient is discharged.  Pt did attend evening group, and does express active suicidal ideation but does contract to speak to staff before doing anything to harm herself here.  Pt denies HI/AVH.   A.  Reassured patient of her safety here in the hospital and assured her that a note will be entered documenting her feelings as well as her mother's at this time. Will also pass on to oncoming day shift RN the patient's statements.  R.  Pt remains safe on the unit, will closely monitor.

## 2019-01-27 ENCOUNTER — Ambulatory Visit (HOSPITAL_COMMUNITY): Payer: BLUE CROSS/BLUE SHIELD

## 2019-01-27 MED ORDER — NICOTINE POLACRILEX 2 MG MT GUM
2.0000 mg | CHEWING_GUM | OROMUCOSAL | Status: DC | PRN
  Administered 2019-01-27 – 2019-02-02 (×16): 2 mg via ORAL
  Filled 2019-01-27 (×3): qty 1
  Filled 2019-01-27: qty 14
  Filled 2019-01-27: qty 1

## 2019-01-27 MED ORDER — ACAMPROSATE CALCIUM 333 MG PO TBEC
666.0000 mg | DELAYED_RELEASE_TABLET | Freq: Three times a day (TID) | ORAL | Status: DC
  Administered 2019-01-28 – 2019-02-03 (×21): 666 mg via ORAL
  Filled 2019-01-27 (×25): qty 2

## 2019-01-27 NOTE — Progress Notes (Signed)
CSW rescheduled patient's intake appointment for PHP to 11/30.  Stephanie Acre, LCSW-A Clinical Social Worker

## 2019-01-27 NOTE — Progress Notes (Signed)
[facility] MD Progress Note  01/27/2019 12:26 PM Keleigh Kazee  MRN:  [id number] Subjective: Patient describes significant/lingering anxiety.  She endorses thoughts of cutting self, not with suicidal intent but "as a way to deal with anxiety".  She acknowledges feeling on ready/anxious regarding discharge from unit.  States "I live alone "I know I will not do well without support" "I still feel too anxious".   At this time denies suicidal ideations although as above endorses intermittent thoughts of cutting.  Denies actual plan or intention.  She does have some future oriented thoughts and is expressing interest in possible more intensive treatment options following discharge, such as IOP setting.  Objective: I have reviewed chart notes, have discussed case with team, have met with patient. 33 year old single female, lives alone, presented voluntarily on 11/15 reporting SI, triggered by an argument with her mother.  Reported chronic depression which had been worsening recently, neurovegetative symptoms, heavy/regular drinking.  Admission BAL was 442.  Endorsed past history of polysubstance abuse (cocaine, opiates, NMDA) but not in several years.  As reviewed, team has been working on discharge planning and disposition options, with a potential plan of referring to IOP.  As per Dr. Karmen Stabs note/signout discharge plan in progress.  Today, however, as above patient is describing increased anxiety, increased thoughts of self cutting and apprehension regarding discharging at this time. Endorses thoughts of self cutting which she relates to increased anxiety, denies actual SI, she does contract for safety on unit. Presents anxious but without psychomotor agitation or restlessness. No psychotic symptoms noted or reported. Tolerating medications well.  Principal Problem: Alcohol dependence with alcohol-induced mood disorder (HCC) Diagnosis: Principal Problem:   Alcohol dependence with alcohol-induced mood disorder  (HCC) Active Problems:   Severe recurrent major depression with psychotic features ([place])  Total Time spent with patient: 20 minutes  Past Psychiatric History: See admission H&P  Past Medical History:  Past Medical History:  Diagnosis Date  . Lupus ([place])    History reviewed. No pertinent surgical history. Family History: History reviewed. No pertinent family history. Family Psychiatric  History: See admission H&P Social History:  Social History   Substance and Sexual Activity  Alcohol Use None     Social History   Substance and Sexual Activity  Drug Use Not on file    Social History   Socioeconomic History  . Marital status: Single    Spouse name: Not on file  . Number of children: Not on file  . Years of education: Not on file  . Highest education level: Not on file  Occupational History  . Not on file  Social Needs  . Financial resource strain: Not on file  . Food insecurity    Worry: Not on file    Inability: Not on file  . Transportation needs    Medical: Not on file    Non-medical: Not on file  Tobacco Use  . Smoking status: Not on file  Substance and Sexual Activity  . Alcohol use: Not on file  . Drug use: Not on file  . Sexual activity: Not on file  Lifestyle  . Physical activity    Days per week: Not on file    Minutes per session: Not on file  . Stress: Not on file  Relationships  . Social Herbalist on phone: Not on file    Gets together: Not on file    Attends religious service: Not on file    Active member of club or  organization: Not on file    Attends meetings of clubs or organizations: Not on file    Relationship status: Not on file  Other Topics Concern  . Not on file  Social History Narrative  . Not on file   Additional Social History:   Sleep: Good  Appetite:  Fair/proving  Current Medications: Current Facility-Administered Medications  Medication Dose Route Frequency Provider Last Rate Last Dose  . acamprosate  (CAMPRAL) tablet 666 mg  666 mg Oral BID AC Sharma Covert, MD   666 mg at 01/27/19 4098  . alum & mag hydroxide-simeth (MAALOX/MYLANTA) 200-200-20 MG/5ML suspension 30 mL  30 mL Oral Q4H PRN Lindon Romp A, NP      . docusate sodium (COLACE) capsule 200 mg  200 mg Oral Daily Cobos, Myer Peer, MD   200 mg at 01/27/19 0718  . feeding supplement (BOOST / RESOURCE BREEZE) liquid 1 Container  1 Container Oral BID BM Cobos, Myer Peer, MD   1 Container at 01/27/19 1100  . gabapentin (NEURONTIN) tablet 800 mg  800 mg Oral TID Sharma Covert, MD   800 mg at 01/27/19 1139  . hydrOXYzine (ATARAX/VISTARIL) tablet 50 mg  50 mg Oral TID PRN Lindell Spar I, NP   50 mg at 01/27/19 0831  . magnesium hydroxide (MILK OF MAGNESIA) suspension 30 mL  30 mL Oral Daily PRN Lindon Romp A, NP   30 mL at 01/25/19 0804  . multivitamin with minerals tablet 1 tablet  1 tablet Oral Daily Lindon Romp A, NP   1 tablet at 01/27/19 0719  . nicotine (NICODERM CQ - dosed in mg/24 hours) patch 14 mg  14 mg Transdermal Daily Sharma Covert, MD   14 mg at 01/27/19 0719  . prazosin (MINIPRESS) capsule 1 mg  1 mg Oral QHS Nwoko, Agnes I, NP   1 mg at 01/26/19 2143  . QUEtiapine (SEROQUEL) tablet 400 mg  400 mg Oral QHS Sharma Covert, MD   400 mg at 01/26/19 2143  . thiamine (VITAMIN B-1) tablet 100 mg  100 mg Oral Daily Lindon Romp A, NP   100 mg at 01/27/19 1191  . traZODone (DESYREL) tablet 100 mg  100 mg Oral QHS Sharma Covert, MD   100 mg at 01/26/19 2143  . venlafaxine XR (EFFEXOR-XR) 24 hr capsule 112.5 mg  112.5 mg Oral Q breakfast Sharma Covert, MD   112.5 mg at 01/27/19 4782    Lab Results: No results found for this or any previous visit (from the past 48 hour(s)).  Blood Alcohol level:  Lab Results  Component Value Date   ETH 442 (HH) 01/19/2019   ETH 474 (HH) 95/62/1308    Metabolic Disorder Labs: Lab Results  Component Value Date   HGBA1C 4.9 01/21/2019   MPG 93.93 01/21/2019   No  results found for: PROLACTIN Lab Results  Component Value Date   CHOL 189 01/21/2019   TRIG 82 01/21/2019   HDL 124 01/21/2019   CHOLHDL 1.5 01/21/2019   VLDL 16 01/21/2019   LDLCALC 49 01/21/2019    Physical Findings: AIMS: Facial and Oral Movements Muscles of Facial Expression: None, normal Lips and Perioral Area: None, normal Jaw: None, normal Tongue: None, normal,Extremity Movements Upper (arms, wrists, hands, fingers): None, normal Lower (legs, knees, ankles, toes): None, normal, Trunk Movements Neck, shoulders, hips: None, normal, Overall Severity Severity of abnormal movements (highest score from questions above): None, normal Incapacitation due to abnormal movements: None, normal Patient's  awareness of abnormal movements (rate only patient's report): No Awareness, Dental Status Current problems with teeth and/or dentures?: No Does patient usually wear dentures?: No  CIWA:  CIWA-Ar Total: 1 COWS:  COWS Total Score: 2  Musculoskeletal: Strength & Muscle Tone: within normal limits Gait & Station: normal Patient leans: N/A  Psychiatric Specialty Exam: Physical Exam  Nursing note and vitals reviewed. Constitutional: She is oriented to person, place, and time. She appears well-developed and well-nourished.  HENT:  Head: Normocephalic and atraumatic.  Respiratory: Effort normal.  Neurological: She is alert and oriented to person, place, and time.    ROS no chest pain, no shortness of breath, no vomiting  Blood pressure 100/66, pulse 93, temperature 97.8 F (36.6 C), temperature source Oral, resp. rate 16, height 5' 5" (1.651 m), weight 62.1 kg, SpO2 100 %.Body mass index is 22.8 kg/m.  General Appearance: Casual  Eye Contact:  Good  Speech:  Normal Rate  Volume:  Normal  Mood:  Acknowledges improving mood, reports increased anxiety  Affect:  Congruent  Thought Process:  Coherent and Descriptions of Associations: Intact  Orientation:  Full (Time, Place, and  Person)  Thought Content:  Linear, well organized  Suicidal Thoughts:  Yes.  without intent/plan-describes self-injurious thoughts of self cutting as a way of addressing anxiety, denies actual SI.  Contracts for safety on unit  Homicidal Thoughts:  No  Memory:  Recent and remote grossly intact  Judgement:  Fair/improving  Insight:  Fair  Psychomotor Activity:  Normal-no current psychomotor agitation  Concentration:  Concentration: Good and Attention Span: Good  Recall:  Good  Fund of Knowledge:  Good  Language:  Good  Akathisia:  Negative  Handed:  Right  AIMS (if indicated):     Assets:  Desire for Improvement Housing Resilience  ADL's:  Intact  Cognition:  WNL  Sleep:  Number of Hours: 6.75   Assessment:  33 year old single female, lives alone, presented voluntarily on 11/15 reporting SI, triggered by an argument with her mother.  Reported chronic depression which had been worsening recently, neurovegetative symptoms, heavy/regular drinking.  Admission BAL was 442.  Endorsed past history of polysubstance abuse (cocaine, opiates, NMDA) but not in several years.  Today patient describes increased anxiety, thoughts of self cutting, and an increased sense of apprehension regarding discharge, which was tentatively planned for today.  Of note, denies suicidal ideations and presents future oriented, interested in IOP.  Does contract for safety on unit.  Tolerating medications well, denies side effects.  Patient reports lorazepam was effective/helpful for anxiety.  It has now been discontinued.  Currently has no significant symptoms of withdrawal and does not present tremulous/vitals are stable.  We have reviewed rationale to avoid open ended/ongoing BZD management.  She expressed understanding.  Treatment Plan Summary: Treatment plan reviewed as below today 11/23 Encourage group and milieu participation Encourage efforts to work on sobriety Continue Neurontin  800 mg p.o. 3 times daily for  anxiety. Continue Hydroxyzine 50 mg p.o. 3 times daily as needed anxiety. Continue Prazosin 1 mg p.o. nightly for nightmares and flashbacks. Continue Nicotine patch to 14 mg transdermally over 24 hours for nicotine dependence. Continue Seroquel 400 mg p.o. nightly for mood stability and insomnia. Continue Campral 666 mg p.o. twice daily for alcohol dependence. Continue Trazodone 100 mg p.o. nightly for insomnia, as needed Discharge Plan in progress .   Jenne Campus, MD 01/27/2019, 12:26 PM   Patient ID: Arlynn Stare, female   DOB: [birth year], 33 y.o.  MRN: [id number]

## 2019-01-27 NOTE — Tx Team (Signed)
Interdisciplinary Treatment and Diagnostic Plan Update  01/27/2019 Time of Session: 9:00am Alison Koch MRN: [id number]  Principal Diagnosis: Alcohol dependence with alcohol-induced mood disorder (HCC)  Secondary Diagnoses: Principal Problem:   Alcohol dependence with alcohol-induced mood disorder (HCC) Active Problems:   Severe recurrent major depression with psychotic features (HCC)   Current Medications:  Current Facility-Administered Medications  Medication Dose Route Frequency Provider Last Rate Last Dose  . acamprosate (CAMPRAL) tablet 666 mg  666 mg Oral BID AC Antonieta Pert, MD   666 mg at 01/27/19 9675  . alum & mag hydroxide-simeth (MAALOX/MYLANTA) 200-200-20 MG/5ML suspension 30 mL  30 mL Oral Q4H PRN Nira Conn A, NP      . docusate sodium (COLACE) capsule 200 mg  200 mg Oral Daily Cobos, Rockey Situ, MD   200 mg at 01/27/19 0718  . feeding supplement (BOOST / RESOURCE BREEZE) liquid 1 Container  1 Container Oral BID BM Cobos, Rockey Situ, MD   1 Container at 01/26/19 1159  . gabapentin (NEURONTIN) tablet 800 mg  800 mg Oral TID Antonieta Pert, MD   800 mg at 01/27/19 9163  . hydrOXYzine (ATARAX/VISTARIL) tablet 50 mg  50 mg Oral TID PRN Armandina Stammer I, NP   50 mg at 01/27/19 0831  . magnesium hydroxide (MILK OF MAGNESIA) suspension 30 mL  30 mL Oral Daily PRN Nira Conn A, NP   30 mL at 01/25/19 0804  . multivitamin with minerals tablet 1 tablet  1 tablet Oral Daily Nira Conn A, NP   1 tablet at 01/27/19 0719  . nicotine (NICODERM CQ - dosed in mg/24 hours) patch 14 mg  14 mg Transdermal Daily Antonieta Pert, MD   14 mg at 01/27/19 0719  . prazosin (MINIPRESS) capsule 1 mg  1 mg Oral QHS Nwoko, Agnes I, NP   1 mg at 01/26/19 2143  . QUEtiapine (SEROQUEL) tablet 400 mg  400 mg Oral QHS Antonieta Pert, MD   400 mg at 01/26/19 2143  . thiamine (VITAMIN B-1) tablet 100 mg  100 mg Oral Daily Nira Conn A, NP   100 mg at 01/27/19 8466  . traZODone  (DESYREL) tablet 100 mg  100 mg Oral QHS Antonieta Pert, MD   100 mg at 01/26/19 2143  . venlafaxine XR (EFFEXOR-XR) 24 hr capsule 112.5 mg  112.5 mg Oral Q breakfast Antonieta Pert, MD   112.5 mg at 01/27/19 5993   PTA Medications: Medications Prior to Admission  Medication Sig Dispense Refill Last Dose  . albuterol (VENTOLIN HFA) 108 (90 Base) MCG/ACT inhaler Inhale 2 puffs into the lungs every 6 (six) hours as needed for wheezing or shortness of breath.      . gabapentin (NEURONTIN) 400 MG capsule Take 400 mg by mouth 3 (three) times daily.     . hydrOXYzine (ATARAX/VISTARIL) 50 MG tablet Take 25 mg by mouth 3 (three) times daily as needed for anxiety, itching or nausea.      Marland Kitchen ibuprofen (ADVIL) 800 MG tablet Take 1 tablet (800 mg total) by mouth 3 (three) times daily. (Patient taking differently: Take 800 mg by mouth every 8 (eight) hours as needed for fever, headache, mild pain, moderate pain or cramping. ) 21 tablet 0   . meclizine (ANTIVERT) 25 MG tablet Take 25 mg by mouth 3 (three) times daily as needed for dizziness.      . Melatonin (MELATONIN MAXIMUM STRENGTH) 5 MG TABS Take 5 mg by mouth at bedtime.     Marland Kitchen  Multiple Vitamin (MULTI-VITAMIN) tablet Take 1 tablet by mouth daily.     . progesterone (PROMETRIUM) 200 MG capsule Take 200 mg by mouth See admin instructions. Only takes for 10 days out of each month, days vary     . QUEtiapine (SEROQUEL) 100 MG tablet Take 100-150 mg by mouth at bedtime as needed (sleep).      . traZODone (DESYREL) 50 MG tablet Take 50 mg by mouth at bedtime as needed.     . venlafaxine XR (EFFEXOR-XR) 75 MG 24 hr capsule Take 75 mg by mouth daily.     . [DISCONTINUED] thiamine (VITAMIN B-1) 100 MG tablet Take 100 mg by mouth daily.       Patient Stressors: Marital or family conflict Substance abuse  Patient Strengths: Capable of independent living Special hobby/interest Work skills  Treatment Modalities: Medication Management, Group therapy, Case  management,  1 to 1 session with clinician, Psychoeducation, Recreational therapy.   Physician Treatment Plan for Primary Diagnosis: Alcohol dependence with alcohol-induced mood disorder (HCC) Long Term Goal(s): Improvement in symptoms so as ready for discharge Improvement in symptoms so as ready for discharge   Short Term Goals: Ability to identify changes in lifestyle to reduce recurrence of condition will improve Ability to verbalize feelings will improve Ability to disclose and discuss suicidal ideas Ability to demonstrate self-control will improve Ability to identify and develop effective coping behaviors will improve  Medication Management: Evaluate patient's response, side effects, and tolerance of medication regimen.  Therapeutic Interventions: 1 to 1 sessions, Unit Group sessions and Medication administration.  Evaluation of Outcomes: Adequate for Discharge  Physician Treatment Plan for Secondary Diagnosis: Principal Problem:   Alcohol dependence with alcohol-induced mood disorder (HCC) Active Problems:   Severe recurrent major depression with psychotic features (HCC)  Long Term Goal(s): Improvement in symptoms so as ready for discharge Improvement in symptoms so as ready for discharge   Short Term Goals: Ability to identify changes in lifestyle to reduce recurrence of condition will improve Ability to verbalize feelings will improve Ability to disclose and discuss suicidal ideas Ability to demonstrate self-control will improve Ability to identify and develop effective coping behaviors will improve     Medication Management: Evaluate patient's response, side effects, and tolerance of medication regimen.  Therapeutic Interventions: 1 to 1 sessions, Unit Group sessions and Medication administration.  Evaluation of Outcomes: Adequate for Discharge   RN Treatment Plan for Primary Diagnosis: Alcohol dependence with alcohol-induced mood disorder (HCC) Long Term Goal(s):  Knowledge of disease and therapeutic regimen to maintain health will improve  Short Term Goals: Ability to identify and develop effective coping behaviors will improve and Compliance with prescribed medications will improve  Medication Management: RN will administer medications as ordered by provider, will assess and evaluate patient's response and provide education to patient for prescribed medication. RN will report any adverse and/or side effects to prescribing provider.  Therapeutic Interventions: 1 on 1 counseling sessions, Psychoeducation, Medication administration, Evaluate responses to treatment, Monitor vital signs and CBGs as ordered, Perform/monitor CIWA, COWS, AIMS and Fall Risk screenings as ordered, Perform wound care treatments as ordered.  Evaluation of Outcomes: Adequate for Discharge   LCSW Treatment Plan for Primary Diagnosis: Alcohol dependence with alcohol-induced mood disorder (HCC) Long Term Goal(s): Safe transition to appropriate next level of care at discharge, Engage patient in therapeutic group addressing interpersonal concerns.  Short Term Goals: Engage patient in aftercare planning with referrals and resources, Increase social support, Increase emotional regulation, Identify triggers associated with mental  health/substance abuse issues and Increase skills for wellness and recovery  Therapeutic Interventions: Assess for all discharge needs, 1 to 1 time with Social worker, Explore available resources and support systems, Assess for adequacy in community support network, Educate family and significant other(s) on suicide prevention, Complete Psychosocial Assessment, Interpersonal group therapy.  Evaluation of Outcomes: Adequate for Discharge   Progress in Treatment: Attending groups: Yes. Participating in groups: Yes. Taking medication as prescribed: Yes. Toleration medication: Yes.  Family/Significant other contact made: Yes, individual(s) contacted:  mother,  Sharyn Lull Patient understands diagnosis: Yes. Discussing patient identified problems/goals with staff: Yes. Medical problems stabilized or resolved: No. Denies suicidal/homicidal ideation: Yes. Issues/concerns per patient self-inventory: Yes.  New problem(s) identified: Yes, Describe:  limited social supports.  New Short Term/Long Term Goal(s): detox, medication management for mood stabilization; elimination of SI thoughts; development of comprehensive mental wellness/sobriety plan.  Patient Goals:  "Get my anxiety under control, I know it effects my insomnia."  Discharge Plan or Barriers: Patient has repeatedly declined referrals for residential substance use treatment. She was expected to start PHP this morning, this will be rescheduled for patient.   Reason for Continuation of Hospitalization: Anxiety Depression Medication stabilization  Estimated Length of Stay: TBD Attendees: Patient: Alison Koch 01/27/2019 9:22 AM  Physician: Queen Blossom 01/27/2019 9:22 AM  Nursing: Rise Paganini, RN 01/27/2019 9:22 AM  RN Care Manager: 01/27/2019 9:22 AM  Social Worker: Stephanie Acre, [employer] 01/27/2019 9:22 AM  Recreational Therapist:  01/27/2019 9:22 AM  Other: Harriett Sine, NP 01/27/2019 9:22 AM  Other:  01/27/2019 9:22 AM  Other: 01/27/2019 9:22 AM    Scribe for Treatment Team: Joellen Jersey, LCSWA 01/27/2019 9:22 AM
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
## 2019-01-27 NOTE — Progress Notes (Signed)
Recreation Therapy Notes  Date:  11.23.20 Time: 0930 Location: [address] Dayroom  Group Topic: Stress Management  Goal Area(s) Addresses:  Patient will identify positive stress management techniques. Patient will identify benefits of using stress management post d/c.  Behavioral Response: Engaged  Intervention: Stress Management  Activity :  Meditation.  LRT introduced the stress management technique of mountain meditation.  LRT played Koch meditation that focused on taking in the characteristics of [place] mountain.  Patients were to listen and follow along as meditation played to fully participate in activity.  Education:  Stress Management, Discharge Planning.   Education Outcome: Acknowledges Education  Clinical Observations/Feedback: Pt attended and participated in activity.    Victorino Sparrow, LRT/CTRS         Alison Koch, Alison Koch 01/27/2019 11:25 AM

## 2019-01-27 NOTE — [facility] Group Notes (Signed)
LCSW Group Therapy Note 01/27/2019 3:07 PM  Type of Therapy and Topic: Group Therapy: Overcoming Obstacles  Participation Level: Active  Description of Group:  In this group patients will be encouraged to explore what they see as obstacles to their own wellness and recovery. They will be guided to discuss their thoughts, feelings, and behaviors related to these obstacles. The group will process together ways to cope with barriers, with attention given to specific choices patients can make. Each patient will be challenged to identify changes they are motivated to make in order to overcome their obstacles. This group will be process-oriented, with patients participating in exploration of their own experiences as well as giving and receiving support and challenge from other group members.  Therapeutic Goals: 1. Patient will identify personal and current obstacles as they relate to admission. 2. Patient will identify barriers that currently interfere with their wellness or overcoming obstacles.  3. Patient will identify feelings, thought process and behaviors related to these barriers. 4. Patient will identify two changes they are willing to make to overcome these obstacles:   Summary of Patient Progress  Markita was engaged and participated throughout the group session. Khristian reports that her main obstacle was the lack of communication she has had with her treatment team while being in the hospital. She reports that she plans to meet with her social worker in order to formulate a secure discharge plan.     Therapeutic Modalities:  Cognitive Behavioral Therapy Solution Focused Therapy Motivational Interviewing Relapse Prevention Therapy   Theresa Duty Clinical Social Worker

## 2019-01-27 NOTE — Progress Notes (Addendum)
D"  Patient's self inventory sheet, patient has fair sleep, sleep medicine helpful.  Poor appetite, low energy level, poor concentration.  Rated anxiety, depression and coping skills #10.  Withdrawals, cravings, agitation, irritability.  SI off/on, contracts for safety.  Physical problems, lighthead, dizzy.  Denied physical pain.  No discharge plans. A:  Medications administered per MD orders.  Emotional support and encouragement given patient. R:  Denied HI.  Denied A/V hallucinations.  SI off/on, contracts for safety.  Safety maintained with 15 minute checks.

## 2019-01-27 NOTE — Plan of Care (Signed)
Nurse discussed anxiety, depression, coping skills with patient. 
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
## 2019-01-27 NOTE — Progress Notes (Signed)
I spoke with patient regarding discharge which was planned for today. Per prior notes, discharge planning was discussed with patient over the last several days, with plan to start IOP today. Discharge SRA and orders were placed by providers yesterday. Patient is agitated this morning and stating, "I will hurt myself if you let me leave here today." Patient states that no one told her that she was discharging today. She states that she never filled out paperwork for IOP and that no one discussed a follow up plan with her. I have discussed with CSW, who states planned discharge to IOP today was discussed with patient last week and appointment was scheduled for today. Patient states she does not feel safe going home, but she is not agreeable to discharging to rehab when this is discussed because "then I won't be able to see my dog." I have reviewed discharge plan with patient and attempted to discuss possible changes in treatment plan for current symptoms but she is agitated and unwilling to discuss at this time. She denies current SI/HI/AVH. Per nursing report, patient's mother called this morning threatening to sue the hospital if patient is discharged today.

## 2019-01-27 NOTE — BHH Counselor (Addendum)
CSW met with patient on unit per patient request.  Patient expressed frustration with providers, stating she felt "caught off guard," when discharge was discussed. Patient states she was unaware of plan for follow up and continuation of care.   CSW reminded patient of their individual conversation on Friday, 11/20. CSW and patient discussed a weekend discharge with patient set to begin [facility] today (11/23). Patient tells CSW she does not remember this conversation because she was "having a panic attack."  Further, patient states she did not feel comfortable discharging today as she had not met CSW to discuss IOP vs residential substance use treatment. CSW reminded patient this was also discussed on Friday, 11/20. Patient declined residential treatment a 2nd time as she wanted to go home to her dog.  CSW asked patient about her follow up preferences. Patient declined residential substance use treatment program referrals a third time, as she states she wants to stay in the area (CSW discussed referrals to [facility] and [facility] in [place].).   Patient states her preference is to follow up with PHP/IOP next Monday, 11/30. Patient earlier told another CSW she was not provided paperwork, but patient furnished completed referral form to CSW during our conversation.  Patient states she still does not feel ready for discharge. She alluded to suicide and stated she wanted to speak with psychiatry again today about increasing her anxiety medications.    Patient asked CSW to call her mother.  CSW called patient's mother, Merilynn Haydu [phone]). Sharyn Lull voiced appreciation for CSW's call and voiced frustration with not hearing back from [facility] and [facility], with whom she has tried to contact.  Mother expressed concerns for patient's stability and safety concerns for the patient discharging home today or tomorrow.   CSW reviewed follow up plan for patient, including PHP and IOP  and was able to provide basic information about the program.  Mother voiced appreciation again for the call, she asked to be notified of treatment updates and a potential discharge date. CSW shared she was unaware of a concrete discharge date at this time, but discharge will likely be within the next few days.   Stephanie Acre, MSW, [place] Social Worker [facility]  [phone]

## 2019-01-28 MED ORDER — QUETIAPINE FUMARATE 50 MG PO TABS
50.0000 mg | ORAL_TABLET | Freq: Two times a day (BID) | ORAL | Status: DC
  Administered 2019-01-28 – 2019-01-30 (×5): 50 mg via ORAL
  Filled 2019-01-28 (×10): qty 1

## 2019-01-28 MED ORDER — TRAZODONE HCL 50 MG PO TABS
50.0000 mg | ORAL_TABLET | Freq: Every evening | ORAL | Status: DC | PRN
  Administered 2019-01-28: 22:00:00 50 mg via ORAL
  Filled 2019-01-28: qty 1

## 2019-01-28 MED ORDER — ENSURE ENLIVE PO LIQD
237.0000 mL | Freq: Two times a day (BID) | ORAL | Status: DC
  Administered 2019-01-28 – 2019-02-03 (×13): 237 mL via ORAL

## 2019-01-28 MED ORDER — QUETIAPINE FUMARATE 300 MG PO TABS
300.0000 mg | ORAL_TABLET | Freq: Every day | ORAL | Status: DC
  Administered 2019-01-28: 300 mg via ORAL
  Filled 2019-01-28 (×2): qty 1

## 2019-01-28 NOTE — Progress Notes (Signed)
Recreation Therapy Notes  Animal-Assisted Activity (AAA) Program Checklist/Progress Notes Patient Eligibility Criteria Checklist & Daily Group note for Rec Tx Intervention  Date: 11.24.20 Time: 35 Location: [address]  AAA/T Program Assumption of Risk Form signed by Teacher, music or Parent Legal Guardian  YES   Patient is free of allergies or sever asthma  YES   Patient reports no fear of animals  YES   Patient reports no history of cruelty to animals  YES   Patient understands his/her participation is voluntary YES   Patient washes hands before animal contact  YES   Patient washes hands after animal contact  YES   Behavioral Response: Engaged  Education: Contractor, Appropriate Animal Interaction   Education Outcome: Acknowledges understanding/In group clarification offered/Needs additional education.   Clinical Observations/Feedback:  Pt attended and participated in activity.   Victorino Sparrow, LRT/CTRS         Alison Koch, Alison Koch 01/28/2019 3:29 PM

## 2019-01-28 NOTE — BHH Counselor (Addendum)
CSW has spoken with patient's mother, Gerry Heaphy [phone]) twice this morning regarding patient's care and discharge planning.  Yesterday patient declined residential substance use treatment referrals from [place] a 3rd time yesterday. Patient has also declined interest in residential treatment programs with providers while inpatient.   This morning, patient has expressed interest in residential treatment programs and expressed concerns for discharging home.   Patient's mother has wished for patient to discharge to a residential program. [place] shared with mother that unfortunately [facility] is outside of the patient's insurance network. CSW left a Advertising account executive for [employer] in [place] requesting a callback to verify insurance benefits.   Mother has called [employer] and stated/requested the following: if [facility] psychiatry can produce documentation stating that [facility] is the ONLY facility that can treat th patient AND that discharging to [facility] is medically necessary.  CSW stated she would document this request, however, CSW explained that [facility] is not the only residential substance use treatment program and that patient may not meet the medical necessity criteria as patient's initial discharge plan was to discharge to [facility] or an IOP program.    Update 3:55pm [facility] returned CSW's call. [address] reports that there is currently a one to two week wait for beds. CSW inquired about insurance authorization and if they are in network, patient will need to complete a ROI and CSW will fax information. Benefits check will take approxiamtely 24-48 hours.   Referral faxed to [facility]. Patient is aware of the wait for insurance benefit check and the wait for a bed and that she will not be held only to wait for a bed. Patient confirms understanding, referral faxed.  Stephanie Acre, MSW, [place] Social Worker [facility]   [phone]

## 2019-01-28 NOTE — Progress Notes (Signed)
   01/28/19 0110  Psych Admission Type (Psych Patients Only)  Admission Status Voluntary  Psychosocial Assessment  Patient Complaints Anxiety  Eye Contact Fair  Facial Expression Anxious  Affect Anxious;Depressed  Speech Logical/coherent  Interaction Assertive  Motor Activity Other (Comment) (WDL)  Appearance/Hygiene Unremarkable  Behavior Characteristics Anxious  Mood Anxious  Thought Process  Coherency WDL  Content WDL  Delusions None reported or observed  Perception WDL  Hallucination None reported or observed  Judgment Poor  Confusion None  Danger to Self  Current suicidal ideation? Denies  Self-Injurious Behavior No self-injurious ideation or behavior indicators observed or expressed   Agreement Not to Harm Self Yes (Pt states that she will not harm self here, but if discharged adamantly feels that she would)  Description of Agreement contracts for safety verbal  Danger to Others  Danger to Others None reported or observed  D: Patient in dayroom reports she had a good day. Pt c/o anxiety  A: Medications administered as prescribed. Support and encouragement provided as needed.  R: Patient remains safe on the unit. Will continue to monitor for safety and stability.

## 2019-01-28 NOTE — Progress Notes (Signed)
D:  Patient's self inventory sheet, patient has good sleep, sleep medication helpful  Depression 6, hopeless 8, anxiety 9.  Withdrawals, cravings, agitation.  SI, contracts for safety.  Physical problems, lightheaded, dizzy, headaches.  Denied physical pain.  Goal manage anxiety and minimize thoughts.  Plans to talk to MD about meds.  Inpatient or IOP.  No discharge plans. A:  Medications administered per MD orders.  Emotional support and encouragements given patient. R:  Safety maintained with 15 minute checks.  SI, contracts for safety.  Denied HI.  Denied A/V hallucinations.

## 2019-01-28 NOTE — Progress Notes (Addendum)
[facility] MD Progress Note  01/28/2019 1:55 PM Alison Koch  MRN:  [id number] Subjective: Patient reports increased anxiety as she approaches discharge.  States she is experiencing significant cravings for alcohol and describes thoughts of cutting self to address anxiety and subjective apprehension. She denies actual suicidal ideations and does present future oriented, expressing interest in going to residential setting at discharge.  Specifically, states her mother has told her about a residential based PHP through [place]. Denies medication side effects.  Objective: I havehave discussed case with team/have met with patient. 33 year old single female, lives alone, presented voluntarily on 11/15 reporting SI, triggered by an argument with her mother.  Reported chronic depression which had been worsening recently, neurovegetative symptoms, heavy/regular drinking.  Admission BAL was 442.  Endorsed past history of polysubstance abuse (cocaine, opiates, NMDA) but not in several years.  Patient reports increased anxiety, described as a vague but persistent sense of apprehension as well as increased alcohol cravings.  She does state that acamprosate helps to some degree.  She describes intermittent thoughts, without associated plan or intention,  of cutting self without suicidal intention but rather to "try to control" anxiety.  She does deny any current plan or intention of hurting herself and is able to contract for safety on unit.  Anxiety has increased over the last couple of days in the context of discharge planning.  At this time she is expressing interest in going to a residential setting and as above states that she has heard that [facility] may offer a PHP which is also residential based. Denies medication side effects. No disruptive or agitated behaviors on unit. 11/23 EKG unremarkable, NSR, QTc 422  Principal Problem: Alcohol dependence with alcohol-induced mood disorder (HCC) Diagnosis:  Principal Problem:   Alcohol dependence with alcohol-induced mood disorder (HCC) Active Problems:   Severe recurrent major depression with psychotic features ([place])  Total Time spent with patient: 20 minutes  Past Psychiatric History: See admission H&P  Past Medical History:  Past Medical History:  Diagnosis Date  . Lupus ([place])    History reviewed. No pertinent surgical history. Family History: History reviewed. No pertinent family history. Family Psychiatric  History: See admission H&P Social History:  Social History   Substance and Sexual Activity  Alcohol Use None     Social History   Substance and Sexual Activity  Drug Use Not on file    Social History   Socioeconomic History  . Marital status: Single    Spouse name: Not on file  . Number of children: Not on file  . Years of education: Not on file  . Highest education level: Not on file  Occupational History  . Not on file  Social Needs  . Financial resource strain: Not on file  . Food insecurity    Worry: Not on file    Inability: Not on file  . Transportation needs    Medical: Not on file    Non-medical: Not on file  Tobacco Use  . Smoking status: Not on file  Substance and Sexual Activity  . Alcohol use: Not on file  . Drug use: Not on file  . Sexual activity: Not on file  Lifestyle  . Physical activity    Days per week: Not on file    Minutes per session: Not on file  . Stress: Not on file  Relationships  . Social Herbalist on phone: Not on file    Gets together: Not on file  Attends religious service: Not on file    Active member of club or organization: Not on file    Attends meetings of clubs or organizations: Not on file    Relationship status: Not on file  Other Topics Concern  . Not on file  Social History Narrative  . Not on file   Additional Social History:   Sleep: Good  Appetite:  Fair/proving  Current Medications: Current Facility-Administered Medications   Medication Dose Route Frequency Provider Last Rate Last Dose  . acamprosate (CAMPRAL) tablet 666 mg  666 mg Oral TID Yaneliz Radebaugh, Myer Peer, MD   666 mg at 01/28/19 1133  . alum & mag hydroxide-simeth (MAALOX/MYLANTA) 200-200-20 MG/5ML suspension 30 mL  30 mL Oral Q4H PRN Lindon Romp A, NP      . docusate sodium (COLACE) capsule 200 mg  200 mg Oral Daily Akash Winski, Myer Peer, MD   200 mg at 01/28/19 0747  . feeding supplement (ENSURE ENLIVE) (ENSURE ENLIVE) liquid 237 mL  237 mL Oral BID BM Hailee Hollick, Myer Peer, MD   237 mL at 01/28/19 1056  . gabapentin (NEURONTIN) tablet 800 mg  800 mg Oral TID Sharma Covert, MD   800 mg at 01/28/19 1133  . hydrOXYzine (ATARAX/VISTARIL) tablet 50 mg  50 mg Oral TID PRN Lindell Spar I, NP   50 mg at 01/28/19 0925  . magnesium hydroxide (MILK OF MAGNESIA) suspension 30 mL  30 mL Oral Daily PRN Lindon Romp A, NP   30 mL at 01/25/19 0804  . multivitamin with minerals tablet 1 tablet  1 tablet Oral Daily Lindon Romp A, NP   1 tablet at 01/28/19 0747  . nicotine polacrilex (NICORETTE) gum 2 mg  2 mg Oral PRN Devonda Pequignot, Myer Peer, MD   2 mg at 01/28/19 1253  . prazosin (MINIPRESS) capsule 1 mg  1 mg Oral QHS Nwoko, Agnes I, NP   1 mg at 01/27/19 2143  . QUEtiapine (SEROQUEL) tablet 400 mg  400 mg Oral QHS Sharma Covert, MD   400 mg at 01/27/19 2143  . QUEtiapine (SEROQUEL) tablet 50 mg  50 mg Oral BID Thanos Cousineau, Myer Peer, MD   50 mg at 01/28/19 0951  . thiamine (VITAMIN B-1) tablet 100 mg  100 mg Oral Daily Lindon Romp A, NP   100 mg at 01/28/19 0747  . traZODone (DESYREL) tablet 50 mg  50 mg Oral QHS PRN Caty Tessler, Myer Peer, MD      . venlafaxine XR (EFFEXOR-XR) 24 hr capsule 112.5 mg  112.5 mg Oral Q breakfast Sharma Covert, MD   112.5 mg at 01/28/19 6962    Lab Results: No results found for this or any previous visit (from the past 48 hour(s)).  Blood Alcohol level:  Lab Results  Component Value Date   ETH 442 (HH) 01/19/2019   ETH 474 (HH) 95/28/4132     Metabolic Disorder Labs: Lab Results  Component Value Date   HGBA1C 4.9 01/21/2019   MPG 93.93 01/21/2019   No results found for: PROLACTIN Lab Results  Component Value Date   CHOL 189 01/21/2019   TRIG 82 01/21/2019   HDL 124 01/21/2019   CHOLHDL 1.5 01/21/2019   VLDL 16 01/21/2019   LDLCALC 49 01/21/2019    Physical Findings: AIMS: Facial and Oral Movements Muscles of Facial Expression: None, normal Lips and Perioral Area: None, normal Jaw: None, normal Tongue: None, normal,Extremity Movements Upper (arms, wrists, hands, fingers): None, normal Lower (legs, knees, ankles, toes): None,  normal, Trunk Movements Neck, shoulders, hips: None, normal, Overall Severity Severity of abnormal movements (highest score from questions above): None, normal Incapacitation due to abnormal movements: None, normal Patient's awareness of abnormal movements (rate only patient's report): No Awareness, Dental Status Current problems with teeth and/or dentures?: No Does patient usually wear dentures?: No  CIWA:  CIWA-Ar Total: 1 COWS:  COWS Total Score: 2  Musculoskeletal: Strength & Muscle Tone: within normal limits Gait & Station: normal Patient leans: N/A  Psychiatric Specialty Exam: Physical Exam  Nursing note and vitals reviewed. Constitutional: She is oriented to person, place, and time. She appears well-developed and well-nourished.  HENT:  Head: Normocephalic and atraumatic.  Respiratory: Effort normal.  Neurological: She is alert and oriented to person, place, and time.    ROS no chest pain, no shortness of breath, no vomiting  Blood pressure (!) 83/51, pulse 98, temperature 99.1 F (37.3 C), temperature source Oral, resp. rate 16, height _0  (1.651 m), weight 62.1 kg, SpO2 100 %.Body mass index is 22.8 kg/m.  General Appearance: Casual  Eye Contact:  Good  Speech:  Normal Rate  Volume:  Normal  Mood:  Describes partially improved mood, endorses increased anxiety   Affect:  Appropriate, anxious, does smile briefly at times during session  Thought Process:  Coherent and Descriptions of Associations: Intact  Orientation:  Full (Time, Place, and Person)  Thought Content:  Linear, well organized  Suicidal Thoughts: Describes intermittent thoughts of self cutting as a way to address anxiety but without suicidal plan or intention.  Denies any actual plan or intention of carrying out self-injurious behaviors and contracts for safety on unit.  Homicidal Thoughts:  No  Memory:  Recent and remote grossly intact  Judgement:  Fair/improving  Insight:  Fair  Psychomotor Activity:  Normal-no current psychomotor agitation  Concentration:  Concentration: Good and Attention Span: Good  Recall:  Good  Fund of Knowledge:  Good  Language:  Good  Akathisia:  Negative  Handed:  Right  AIMS (if indicated):     Assets:  Desire for Improvement Housing Resilience  ADL's:  Intact  Cognition:  WNL  Sleep:  Number of Hours: 6.75   Assessment:  33 year old single female, lives alone, presented voluntarily on 11/15 reporting SI, triggered by an argument with her mother.  Reported chronic depression which had been worsening recently, neurovegetative symptoms, heavy/regular drinking.  Admission BAL was 442.  Endorsed past history of polysubstance abuse (cocaine, opiates, NMDA) but not in several years.  Patient is reporting/experiencing increased anxiety/apprehension as she approaches discharge.  Specifically she is describing increased anxiety associated with thoughts of self cutting as a way to address anxiety, but without any specific plan or intention of doing so.  Currently he is able to contract for safety and presents future oriented, expressing interest in going to a residential setting, specifically a program she believes is offered through [facility] .  Of note, I have reviewed this with CSW and reported that said program is outpatient based/not residential..  She also  expresses increased cravings for alcohol.  She is tolerating medications well at this time. Will start low Seroquel doses during the day to help address anxiety- has tolerated Seroquel well thus far .   Treatment Plan Summary: Treatment plan reviewed as below today 11/24 Encourage group and milieu participation Encourage efforts to work on sobriety Continue Neurontin  800 mg p.o. 3 times daily for anxiety. Continue Hydroxyzine 50 mg p.o. 3 times daily as needed anxiety. Continue  Prazosin 1 mg p.o. nightly for nightmares and flashbacks. Continue Nicotine patch to 14 mg transdermally over 24 hours for nicotine dependence. Change Seroquel to 50 mgrs bid and  300 mg nightly for mood stability and insomnia. Have increased  Campral to  666 mg p.o.three times daily for alcohol dependence ( CrCl 78.9) Continue Trazodone 100 mg p.o. nightly for insomnia, as needed Discharge Plan in progress .   Jenne Campus, MD 01/28/2019, 1:55 PM   Patient ID: Alison Koch, female   DOB: November 24, 1985, 33 y.o.   MRN: [id number]

## 2019-01-28 NOTE — BHH Group Notes (Signed)
Adult Psychoeducational Group Note  Date:  01/28/2019 Time:  2:21 AM  Group Topic/Focus:  Wrap-Up Group:   The focus of this group is to help patients review their daily goal of treatment and discuss progress on daily workbooks.  Participation Level:  Active  Participation Quality:  Appropriate  Affect:  Appropriate  Cognitive:  Appropriate  Insight: Appropriate  Engagement in Group:  Engaged  Modes of Intervention:  Discussion  Additional Comments:  Pt goal was to meet with social worker to discuss long term treatment programs to transition to once being discharged from [facility].  Pt stated she did meet her goal.  Pt rated the day at a 8/10.  Cola Highfill 01/28/2019, 2:21 AM

## 2019-01-29 MED ORDER — HYDROXYZINE HCL 50 MG PO TABS
50.0000 mg | ORAL_TABLET | Freq: Every day | ORAL | Status: DC
  Administered 2019-01-29: 21:00:00 50 mg via ORAL
  Filled 2019-01-29 (×3): qty 1

## 2019-01-29 MED ORDER — TRAZODONE HCL 150 MG PO TABS
150.0000 mg | ORAL_TABLET | Freq: Every day | ORAL | Status: DC
  Administered 2019-01-29: 150 mg via ORAL
  Filled 2019-01-29 (×3): qty 1

## 2019-01-29 MED ORDER — QUETIAPINE FUMARATE 300 MG PO TABS
300.0000 mg | ORAL_TABLET | Freq: Every day | ORAL | Status: DC
  Administered 2019-01-29: 21:00:00 300 mg via ORAL
  Filled 2019-01-29 (×3): qty 1

## 2019-01-29 MED ORDER — TRAZODONE HCL 150 MG PO TABS: 150.0000 mg | ORAL_TABLET | Freq: Every day | ORAL | Status: DC

## 2019-01-29 NOTE — Progress Notes (Signed)
Patient shared in group about her history of substance abuse. 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 She is presently looking for a 28 day drug treatment program and is willing to go wherever a bed is available. Her goal for tomorrow is to speak with the social worker about long term treatment.

## 2019-01-29 NOTE — Progress Notes (Signed)
Adult Psychoeducational Group Note  Date:  01/29/2019 Time:  11:41 AM  Group Topic/Focus:  Identifying Needs:   The focus of this group is to help patients identify their personal needs that have been historically problematic and identify healthy behaviors to address their needs.  Participation Level:  Active  Participation Quality:  Appropriate  Affect:  Appropriate  Cognitive:  Alert  Insight: Good  Engagement in Group:  Engaged  Modes of Intervention:  Discussion and Education  Additional Comments:    Pt participated in group with the MHT. Today's topic of the day is personal development. During group the MHT discussed Maslow's hierarchy of needs. As group staff and pts discussed the different levels of the hierarchy and if they have or have not met the requirements to advance to to the next stage. Pt's discussed issues they have at each level. As a group it was discussed how to overcome issues to attempt to reach the top goal of self actualization.   Lita Mains 01/29/2019, 11:41 AM

## 2019-01-29 NOTE — Progress Notes (Addendum)
[facility] MD Progress Note  01/29/2019 11:41 AM Alison Koch  MRN:  [id number]  Subjective: Alison Koch reports, "It is okay, it's just that my mood is not good. I did not sleep at all last night. The doctor changed my Seroquel night time dose to 300 mg & started me on Seroquel 50 mg bid yesterday. I can understand that the daytime dose of Seroquel 50 mg is helpful, I still got to sleep at night. I feel supper anxious during the day, just like I'm now. I definitely need my medications right or I'm never going to feel right".    Objective: Patient is a 33 year old female who presented to the behavioral health hospital as a walk-in on 01/19/2019 accompanied by [facility] department for suicidal ideation. Patient stated at that point that she had gotten into an argument with her mother and boyfriend and just stated that she "wanted to die". She was admitted to the hospital for evaluation and stabilization. 01-29-19: Patient is seen, chart reviewed. The chart findings discussed with the treatment team. She presents alert, oriented & aware of situation. She is making good eye contact & verbally responsive. She is visible on unit & attending group sessions. She is endorsing high anxiety levels. She says her mood is not good. She continues to seek medications after medications for her symptoms. She is saying that her medications are not right or she will not be feeling so bad. She continues to endorse high symptoms of depression & anxiety. Rates depression #9 & anxiety #10. She denies any SIHI, AVH, delusional thoughts or paranoia. She remains hesitant about being discharged & yet unable to make up her mind about long term substance abuse treatment. The Social worker had said this morning that most of the long term substance abuse treatment programs available are outside patient's insurance network. Patient's mother is wishing for patient to be discharged from [facility] to a long term substance abuse treatment center. Again,  patient's medications has been adjusted to help manage her presenting symptoms. See the treatment plan for the adjustments made on her medications.  Principal Problem: Alcohol dependence with alcohol-induced mood disorder (HCC)  Diagnosis: Principal Problem:   Alcohol dependence with alcohol-induced mood disorder (HCC) Active Problems:   Severe recurrent major depression with psychotic features (HCC)  Total Time spent with patient: 15 minutes  Past Psychiatric History: See admission H&P  Past Medical History:  Past Medical History:  Diagnosis Date  . Lupus (HCC)    History reviewed. No pertinent surgical history.  Family History: History reviewed. No pertinent family history.  Family Psychiatric  History: See admission H&P  Social History:  Social History   Substance and Sexual Activity  Alcohol Use None     Social History   Substance and Sexual Activity  Drug Use Not on file    Social History   Socioeconomic History  . Marital status: Single    Spouse name: Not on file  . Number of children: Not on file  . Years of education: Not on file  . Highest education level: Not on file  Occupational History  . Not on file  Social Needs  . Financial resource strain: Not on file  . Food insecurity    Worry: Not on file    Inability: Not on file  . Transportation needs    Medical: Not on file    Non-medical: Not on file  Tobacco Use  . Smoking status: Not on file  Substance and Sexual Activity  . Alcohol use:  Not on file  . Drug use: Not on file  . Sexual activity: Not on file  Lifestyle  . Physical activity    Days per week: Not on file    Minutes per session: Not on file  . Stress: Not on file  Relationships  . Social Musician on phone: Not on file    Gets together: Not on file    Attends religious service: Not on file    Active member of club or organization: Not on file    Attends meetings of clubs or organizations: Not on file     Relationship status: Not on file  Other Topics Concern  . Not on file  Social History Narrative  . Not on file   Additional Social History:   Sleep: Fair  Appetite:  Fair  Current Medications: Current Facility-Administered Medications  Medication Dose Route Frequency Provider Last Rate Last Dose  . acamprosate (CAMPRAL) tablet 666 mg  666 mg Oral TID Cobos, Rockey Situ, MD   666 mg at 01/29/19 1123  . alum & mag hydroxide-simeth (MAALOX/MYLANTA) 200-200-20 MG/5ML suspension 30 mL  30 mL Oral Q4H PRN Nira Conn A, NP   30 mL at 01/28/19 2004  . docusate sodium (COLACE) capsule 200 mg  200 mg Oral Daily Cobos, Rockey Situ, MD   200 mg at 01/28/19 0747  . feeding supplement (ENSURE ENLIVE) (ENSURE ENLIVE) liquid 237 mL  237 mL Oral BID BM Cobos, Rockey Situ, MD   237 mL at 01/29/19 1006  . gabapentin (NEURONTIN) tablet 800 mg  800 mg Oral TID Antonieta Pert, MD   800 mg at 01/29/19 1123  . hydrOXYzine (ATARAX/VISTARIL) tablet 50 mg  50 mg Oral TID PRN Armandina Stammer I, NP   50 mg at 01/29/19 1006  . hydrOXYzine (ATARAX/VISTARIL) tablet 50 mg  50 mg Oral QHS Nwoko, Agnes I, NP      . magnesium hydroxide (MILK OF MAGNESIA) suspension 30 mL  30 mL Oral Daily PRN Nira Conn A, NP   30 mL at 01/25/19 0804  . multivitamin with minerals tablet 1 tablet  1 tablet Oral Daily Nira Conn A, NP   1 tablet at 01/29/19 0847  . nicotine polacrilex (NICORETTE) gum 2 mg  2 mg Oral PRN Cobos, Rockey Situ, MD   2 mg at 01/29/19 1009  . prazosin (MINIPRESS) capsule 1 mg  1 mg Oral QHS Nwoko, Agnes I, NP   1 mg at 01/28/19 2129  . QUEtiapine (SEROQUEL) tablet 300 mg  300 mg Oral QHS Nwoko, Agnes I, NP      . QUEtiapine (SEROQUEL) tablet 50 mg  50 mg Oral BID Cobos, Rockey Situ, MD   50 mg at 01/29/19 0847  . thiamine (VITAMIN B-1) tablet 100 mg  100 mg Oral Daily Nira Conn A, NP   100 mg at 01/29/19 0848  . traZODone (DESYREL) tablet 150 mg  150 mg Oral QHS Nwoko, Agnes I, NP      . venlafaxine XR  (EFFEXOR-XR) 24 hr capsule 112.5 mg  112.5 mg Oral Q breakfast Antonieta Pert, MD   112.5 mg at 01/29/19 0848   Lab Results: No results found for this or any previous visit (from the past 48 hour(s)).  Blood Alcohol level:  Lab Results  Component Value Date   ETH 442 [facility]) 01/19/2019   ETH 474 (HH) 09/12/2018   Metabolic Disorder Labs: Lab Results  Component Value Date   HGBA1C 4.9 01/21/2019  MPG 93.93 01/21/2019   No results found for: PROLACTIN Lab Results  Component Value Date   CHOL 189 01/21/2019   TRIG 82 01/21/2019   HDL 124 01/21/2019   CHOLHDL 1.5 01/21/2019   VLDL 16 01/21/2019   LDLCALC 49 01/21/2019   Physical Findings: AIMS: Facial and Oral Movements Muscles of Facial Expression: None, normal Lips and Perioral Area: None, normal Jaw: None, normal Tongue: None, normal,Extremity Movements Upper (arms, wrists, hands, fingers): None, normal Lower (legs, knees, ankles, toes): None, normal, Trunk Movements Neck, shoulders, hips: None, normal, Overall Severity Severity of abnormal movements (highest score from questions above): None, normal Incapacitation due to abnormal movements: None, normal Patient's awareness of abnormal movements (rate only patient's report): No Awareness, Dental Status Current problems with teeth and/or dentures?: No Does patient usually wear dentures?: No  CIWA:  CIWA-Ar Total: 1 COWS:  COWS Total Score: 2  Musculoskeletal: Strength & Muscle Tone: within normal limits Gait & Station: normal Patient leans: N/A  Psychiatric Specialty Exam: Physical Exam  Nursing note and vitals reviewed. Constitutional: She is oriented to person, place, and time. She appears well-developed and well-nourished.  HENT:  Head: Normocephalic and atraumatic.  Respiratory: Effort normal.  Neurological: She is alert and oriented to person, place, and time.    Review of Systems  Constitutional: Negative for chills and fever.  Respiratory: Negative  for cough, shortness of breath and wheezing.   Cardiovascular: Negative for chest pain and palpitations.  Gastrointestinal: Negative for abdominal pain, heartburn, nausea and vomiting.  Neurological: Negative for dizziness and headaches.  Psychiatric/Behavioral: Positive for depression and substance abuse (Hx. alcoholism, chronic). Negative for hallucinations, memory loss and suicidal ideas. The patient is nervous/anxious and has insomnia.     Blood pressure 91/65, pulse 98, temperature 97.7 F (36.5 C), temperature source Oral, resp. rate 16, height 5\' 5"  (1.651 m), weight 62.1 kg, SpO2 96 %.Body mass index is 22.8 kg/m.  General Appearance: Disheveled  Eye Contact:  Fair  Speech:  Normal Rate  Volume:  Increased  Mood:  Anxious and Depressed  Affect:  Congruent  Thought Process:  Coherent and Descriptions of Associations: Intact  Orientation:  Full (Time, Place, and Person)  Thought Content:  Logical  Suicidal Thoughts:  No  Homicidal Thoughts:  No  Memory:  Immediate;   Fair Recent;   Fair Remote;   Fair  Judgement:  Impaired  Insight:  Fair  Psychomotor Activity:  Increased  Concentration:  Concentration: Fair and Attention Span: Fair  Recall:  [employer] of Knowledge:  Fair  Language:  Fair  Akathisia:  Negative  Handed:  Right  AIMS (if indicated):     Assets:  Desire for Improvement Resilience  ADL's:  Intact  Cognition:  WNL  Sleep:  Number of Hours: 6.75   Treatment Plan Summary: Daily contact with patient to assess and evaluate symptoms and progress in treatment and Medication management.  -Continue inpatient hospitalization.  -Will continue today 01/29/2019 plan as below except where it is noted.  Diagnosis: #1 substance-induced mood disorder,  #2 alcohol dependence,  #3 alcohol withdrawal syndrome,  #4 history of opiate dependence,  #5 cannabis dependence  1.  Continue Campral 666 mg po tid for alcoholism. 2. Continue gabapentin to 600 mg p.o. 3 times  daily for anxiety and chronic pain. 3. Continue hydroxyzine 50 mg p.o. 3 times daily as needed anxiety & Vistaril 50 mg po Q hs for insomnia.Marland Kitchen 4.  Discontinued lorazepam 3 times daily to tid prn  for anxiety. 5. Continue multivitamin 1 tablet p.o. daily for nutritional supplementation. 6. Continue Seroquel 300 mg p.o. nightly for mood stability & sleep. 7.  Continue Seroquel 50 mg po bid for agitation. 8. Continue thiamine 100 mg p.o. daily for nutritional supplementation. 9. Increased trazodone from 100 mg to 150 mg p.o. nightly standing for insomnia. 10. Continue venlafaxine extended release 75 mg p.o. daily for depression and anxiety. 11. Continue Minipress 1 mg po Q hs for nightmares. 12. Disposition planning-in progress.  Armandina StammerAgnes Nwoko, NP, PMHNP, FNP-BC 01/29/2019, 11:41 AMPatient ID: Alison Koch, female   DOB: [birth year], 33 y.o.   MRN: [id number] Patient ID: Alison Koch, female   DOB: [birth year], 33 y.o.   MRN: [id number] Agree with NP Progress Note

## 2019-01-29 NOTE — BHH Counselor (Addendum)
CSW spoke with patient's mother, Sharyn Lull [phone]) regarding patient's care and discharge planning.  Mother states awareness that patient was referred to [facility]. Per mother, she is aware of the 2 week wait for a bed (patient's referral was sent 11/24 and had not been reviewed), and that in her opinion, it is not a safe or reasonable follow up plan unless this patient is to remain inpatient at [facility] until admission.  Mother inquired why CSW had not referred patient to [facility]. CSW explained that [facility] is a program that focuses on substance use treatment, rather than mental health or dual diagnosis, as such, patients with a history of SI, self harm, or acute mental health needs are typically declined and referred to dual diagnosis programs- such as [facility]. Patient's mother expressed frustration with CSW not referring patient, mother states she has looked at [facility] and states this facility should be able to meet this patient's needs.  CSW obtained permission from patient to fax referral information to [facility]. CSW spoke with intake coordinator at [facility], who reports there is a 1 week wait for female beds.  CSW will follow up.    Update 2:55pm  [facility] admissions has not completed screening to determine if patient is psychiatrically and medically appropriate for admission. Admissions shared with CSW that this patient is out of their network, CSW is aware of this and shared that patient's mother has been working with [facility] to authorize services. Admissions reports that if medically and psychiatrically appropriate, this patient would need to private pay $17,500. Admissions offered to review this with patient and her mother, patient was agreeable, CSW provided contact information for mother and transferred the call to [employer] phone, where patient answered.   Update 3:45pm  Per mother's request, CSW called  back to confirm with mother that a referral was sent to [facility]. CSW also updated that patient has not been psychiatrically or medically cleared by facility and that there is a 1 week wait for beds.  Mother reports she received a call from [facility] regarding insurance and private pay and she plans to have a 3 way call with [place] and [facility].  Mother expressed appreciation for the update.   Update 4:00pm  Mother called back, again. Mother followed up with the [facility] on her own and per mother, patient was declined from [facility] due to [place] and behavioral issues.   Mother asked that CSW's continue to pursue a [facility] referral. CSW attempted to reach intake office without success, CSW advised mother that CSWs can follow up on after the Thanksgiving Holiday.    Stephanie Acre, MSW, [place] Social Worker [facility] Adult Unit  [phone]

## 2019-01-29 NOTE — Plan of Care (Signed)
Progress note  D: pt found in the dayroom interacting with peers; compliant with medication administration. Pt is upbeat about treatment centers they may be attending. Pt is adamant that they need treatment. Pt denies any physical complaints or pain.Pt denies si/hi/ah/vh and verbally agrees to approach staff if these become apparent or before harming themself/others while at [place].  A: Pt provided support and encouragement. Pt given medication per protocol and standing orders. Q8m safety checks implemented and continued.  R: Pt safe on the unit. Will continue to monitor.  Pt progressing in the following metrics  Problem: Education: Goal: Knowledge of [facility] Education information/materials will improve Outcome: Progressing   Problem: Health Behavior/Discharge Planning: Goal: Identification of resources available to assist in meeting health care needs will improve Outcome: Progressing Goal: Compliance with treatment plan for underlying cause of condition will improve Outcome: Progressing   Problem: Physical Regulation: Goal: Ability to maintain clinical measurements within normal limits will improve Outcome: Progressing

## 2019-01-29 NOTE — Plan of Care (Signed)
Patient stayed in the milieu until bedtime. Attended groups and other activities.  Complained of anxiety and received Vistaril. Currently in bed sleeping. No sign of distress. Safety monitored as recommended.

## 2019-01-30 MED ORDER — QUETIAPINE FUMARATE 400 MG PO TABS
400.0000 mg | ORAL_TABLET | Freq: Every day | ORAL | Status: DC
  Administered 2019-01-30: 400 mg via ORAL
  Filled 2019-01-30: qty 2
  Filled 2019-01-30 (×3): qty 1

## 2019-01-30 MED ORDER — TRAZODONE HCL 50 MG PO TABS: 50.0000 mg | ORAL_TABLET | Freq: Every evening | ORAL | Status: DC | PRN

## 2019-01-30 MED ORDER — VENLAFAXINE HCL ER 150 MG PO CP24
150.0000 mg | ORAL_CAPSULE | Freq: Every day | ORAL | Status: DC
  Administered 2019-01-31 – 2019-02-03 (×4): 150 mg via ORAL
  Filled 2019-01-30 (×7): qty 1

## 2019-01-30 MED ORDER — HYDROXYZINE HCL 25 MG PO TABS
25.0000 mg | ORAL_TABLET | Freq: Three times a day (TID) | ORAL | Status: DC | PRN
  Administered 2019-01-30 – 2019-02-03 (×12): 25 mg via ORAL
  Filled 2019-01-30 (×12): qty 1

## 2019-01-30 MED ORDER — QUETIAPINE FUMARATE 25 MG PO TABS
25.0000 mg | ORAL_TABLET | Freq: Two times a day (BID) | ORAL | Status: DC
  Administered 2019-01-30 – 2019-01-31 (×2): 25 mg via ORAL
  Filled 2019-01-30 (×8): qty 1

## 2019-01-30 NOTE — Progress Notes (Signed)
   01/29/19 2300  Psych Admission Type (Psych Patients Only)  Admission Status Voluntary  Psychosocial Assessment  Patient Complaints None  Eye Contact Fair  Facial Expression Animated;Anxious  Affect Anxious;Appropriate to circumstance  Speech Logical/coherent  Interaction Assertive;Attention-seeking  Motor Activity Slow  Appearance/Hygiene Improved;Unremarkable  Behavior Characteristics Appropriate to situation  Mood Anxious  Thought Process  Coherency WDL  Content WDL  Delusions None reported or observed  Perception WDL  Hallucination None reported or observed  Judgment WDL  Confusion None  Danger to Self  Current suicidal ideation? Denies  Self-Injurious Behavior No self-injurious ideation or behavior indicators observed or expressed   Agreement Not to Harm Self Yes  Description of Agreement contracts for safety verbal  Danger to Others  Danger to Others None reported or observed

## 2019-01-30 NOTE — Progress Notes (Addendum)
[facility] MD Progress Note  01/30/2019 3:51 PM Alison Koch  MRN:  [id number] Subjective: Patient describes ongoing anxiety, mainly related to disposition planning . Denies SI.  Has reported intermittent thoughts of cutting or scratching self but not today. Currently does not endorse medication side effects   Objective: I have reviewed chart notes and met with patient 33 year old single female, lives alone, presented voluntarily on 11/15 reporting SI, triggered by an argument with her mother.  Reported chronic depression which had been worsening recently, neurovegetative symptoms, heavy/regular drinking.  Admission BAL was 442.  Endorsed past history of polysubstance abuse (cocaine, opiates, NMDA) but not in several years.  Patient presents alert, attentive, vaguely anxious.  Acknowledges improving mood, does endorse persistent anxiety.  Today denies suicidal ideations, has recently endorsed intermittent thoughts of cutting as a way to address anxiety but without plan or intention of acting out on these thoughts.  Today does not endorse self-injurious thoughts. She describes significant anxiety related to discharge planning.  In brief, she lives alone and states that she has nowhere else to go after discharge.  Expresses apprehension that once she returns home she may relapse, which in turn would result in psychiatric decompensation.  Due to this has been wanting to go to a rehab setting at discharge.  Her mother has also been very involved in this disposition option and has echoed patient's concerns as above.  CSW has been working on rehab settings, but without success thus far, most recently patient was not accepted at [facility].  Referrals have been sent to [facility] treatment center and response is currently pending.  She is visible on unit, interacting with peers.  No disruptive or agitated behaviors. Tolerating medications well, but is requesting to increase Seroquel nightly dose to help with  insomnia.  Patient was being prescribed Seroquel prior to this admission for "anxiety, mood, sleep".  Denies side effects.  We have reviewed side effect profile to include potential for movement disorders/akathisia/weight gain/metabolic disturbances.   Principal Problem: Alcohol dependence with alcohol-induced mood disorder (HCC) Diagnosis: Principal Problem:   Alcohol dependence with alcohol-induced mood disorder (HCC) Active Problems:   Severe recurrent major depression with psychotic features ([place])  Total Time spent with patient: 15 minutes  Past Psychiatric History: See admission H&P  Past Medical History:  Past Medical History:  Diagnosis Date  . Lupus ([place])    History reviewed. No pertinent surgical history. Family History: History reviewed. No pertinent family history. Family Psychiatric  History: See admission H&P Social History:  Social History   Substance and Sexual Activity  Alcohol Use None     Social History   Substance and Sexual Activity  Drug Use Not on file    Social History   Socioeconomic History  . Marital status: Single    Spouse name: Not on file  . Number of children: Not on file  . Years of education: Not on file  . Highest education level: Not on file  Occupational History  . Not on file  Social Needs  . Financial resource strain: Not on file  . Food insecurity    Worry: Not on file    Inability: Not on file  . Transportation needs    Medical: Not on file    Non-medical: Not on file  Tobacco Use  . Smoking status: Not on file  Substance and Sexual Activity  . Alcohol use: Not on file  . Drug use: Not on file  . Sexual activity: Not on file  Lifestyle  .  Physical activity    Days per week: Not on file    Minutes per session: Not on file  . Stress: Not on file  Relationships  . Social Herbalist on phone: Not on file    Gets together: Not on file    Attends religious service: Not on file    Active member of club or  organization: Not on file    Attends meetings of clubs or organizations: Not on file    Relationship status: Not on file  Other Topics Concern  . Not on file  Social History Narrative  . Not on file   Additional Social History:   Sleep: Fair  Appetite: Improving  Current Medications: Current Facility-Administered Medications  Medication Dose Route Frequency Provider Last Rate Last Dose  . acamprosate (CAMPRAL) tablet 666 mg  666 mg Oral TID Kamilia Carollo, Myer Peer, MD   666 mg at 01/30/19 1202  . alum & mag hydroxide-simeth (MAALOX/MYLANTA) 200-200-20 MG/5ML suspension 30 mL  30 mL Oral Q4H PRN Lindon Romp A, NP   30 mL at 01/28/19 2004  . docusate sodium (COLACE) capsule 200 mg  200 mg Oral Daily Lerlene Treadwell, Myer Peer, MD   200 mg at 01/28/19 0747  . feeding supplement (ENSURE ENLIVE) (ENSURE ENLIVE) liquid 237 mL  237 mL Oral BID BM Oris Staffieri, Myer Peer, MD   237 mL at 01/30/19 1535  . gabapentin (NEURONTIN) tablet 800 mg  800 mg Oral TID Sharma Covert, MD   800 mg at 01/30/19 1202  . hydrOXYzine (ATARAX/VISTARIL) tablet 50 mg  50 mg Oral TID PRN Lindell Spar I, NP   50 mg at 01/30/19 1533  . hydrOXYzine (ATARAX/VISTARIL) tablet 50 mg  50 mg Oral QHS Lindell Spar I, NP   50 mg at 01/29/19 2119  . magnesium hydroxide (MILK OF MAGNESIA) suspension 30 mL  30 mL Oral Daily PRN Lindon Romp A, NP   30 mL at 01/29/19 2014  . multivitamin with minerals tablet 1 tablet  1 tablet Oral Daily Lindon Romp A, NP   1 tablet at 01/30/19 0828  . nicotine polacrilex (NICORETTE) gum 2 mg  2 mg Oral PRN Etha Stambaugh, Myer Peer, MD   2 mg at 01/30/19 1533  . prazosin (MINIPRESS) capsule 1 mg  1 mg Oral QHS Nwoko, Agnes I, NP   1 mg at 01/29/19 2119  . QUEtiapine (SEROQUEL) tablet 300 mg  300 mg Oral QHS Lindell Spar I, NP   300 mg at 01/29/19 2119  . QUEtiapine (SEROQUEL) tablet 50 mg  50 mg Oral BID Layloni Fahrner, Myer Peer, MD   50 mg at 01/30/19 0263  . thiamine (VITAMIN B-1) tablet 100 mg  100 mg Oral Daily Lindon Romp A, NP   100 mg at 01/30/19 7858  . traZODone (DESYREL) tablet 150 mg  150 mg Oral QHS Lindell Spar I, NP   150 mg at 01/29/19 2119  . venlafaxine XR (EFFEXOR-XR) 24 hr capsule 112.5 mg  112.5 mg Oral Q breakfast Sharma Covert, MD   112.5 mg at 01/30/19 8502    Lab Results: No results found for this or any previous visit (from the past 48 hour(s)).  Blood Alcohol level:  Lab Results  Component Value Date   ETH 442 [facility]) 01/19/2019   ETH 474 (HH) 77/41/2878    Metabolic Disorder Labs: Lab Results  Component Value Date   HGBA1C 4.9 01/21/2019   MPG 93.93 01/21/2019   No results found for:  PROLACTIN Lab Results  Component Value Date   CHOL 189 01/21/2019   TRIG 82 01/21/2019   HDL 124 01/21/2019   CHOLHDL 1.5 01/21/2019   VLDL 16 01/21/2019   LDLCALC 49 01/21/2019    Physical Findings: AIMS: Facial and Oral Movements Muscles of Facial Expression: None, normal Lips and Perioral Area: None, normal Jaw: None, normal Tongue: None, normal,Extremity Movements Upper (arms, wrists, hands, fingers): None, normal Lower (legs, knees, ankles, toes): None, normal, Trunk Movements Neck, shoulders, hips: None, normal, Overall Severity Severity of abnormal movements (highest score from questions above): None, normal Incapacitation due to abnormal movements: None, normal Patient's awareness of abnormal movements (rate only patient's report): No Awareness, Dental Status Current problems with teeth and/or dentures?: No Does patient usually wear dentures?: No  CIWA:  CIWA-Ar Total: 1 COWS:  COWS Total Score: 2  Musculoskeletal: Strength & Muscle Tone: within normal limits Gait & Station: normal Patient leans: N/A  Psychiatric Specialty Exam: Physical Exam  Nursing note and vitals reviewed. Constitutional: She is oriented to person, place, and time. She appears well-developed and well-nourished.  HENT:  Head: Normocephalic and atraumatic.  Respiratory: Effort normal.   Neurological: She is alert and oriented to person, place, and time.    ROS no chest pain, no shortness of breath, no vomiting  Blood pressure (!) 80/65, pulse (!) 108, temperature 98 F (36.7 C), temperature source Oral, resp. rate 16, height '5\' 5"'$  (1.651 m), weight 62.1 kg, SpO2 96 %.Body mass index is 22.8 kg/m.  General Appearance: Casual  Eye Contact:  Good  Speech:  Normal Rate  Volume:  Normal  Mood:  Improving mood, remains anxious  Affect:  Congruent, anxious  Thought Process:  Coherent and Descriptions of Associations: Intact  Orientation:  Full (Time, Place, and Person)  Thought Content: Ruminative, no hallucinations, no delusions  Suicidal Thoughts: No current no current suicidal ideations, contracts for safety on unit  Homicidal Thoughts:  No  Memory:  Recent and remote grossly intact  Judgement:  improving  Insight:  Fair/improving  Psychomotor Activity:  Normal-no current psychomotor restlessness  Concentration:  Concentration: Good and Attention Span: Good  Recall:  Good  Fund of Knowledge:  Good  Language:  Good  Akathisia:  Negative  Handed:  Right  AIMS (if indicated):     Assets:  Desire for Improvement Housing Resilience  ADL's:  Intact  Cognition:  WNL  Sleep:  Number of Hours: 5.25   Assessment:  33 year old single female, lives alone, presented voluntarily on 11/15 reporting SI, triggered by an argument with her mother.  Reported chronic depression which had been worsening recently, neurovegetative symptoms, heavy/regular drinking.  Admission BAL was 442.  Endorsed past history of polysubstance abuse (cocaine, opiates, NMDA) but not in several years.  Patient presents with gradual improvement, particularly regarding her mood.  She does continue to describe significant anxiety, mainly related to concern she may relapse if returns home (describes limited support network/living alone).  Both patient and mother, who has been involved in care and has  maintained communication with team, are interested for her to go to a rehab setting at discharge.  I do agree this would be optimal if options available.  Unfortunately she has not been accepted to rehab as of yet.  CSW working towards this goal. Patient reports decreased quality of sleep since Seroquel was decreased from 400 mg nightly to 300 mg nightly.  Treatment Plan Summary: Treatment plan reviewed as below today 11/26 Encourage group and milieu participation  Encourage efforts to work on sobriety Continue Neurontin  800 mg p.o. 3 times daily for anxiety. Continue Hydroxyzine 50 mg p.o. 3 times daily as needed anxiety. Continue Prazosin 1 mg p.o. nightly for nightmares and flashbacks. Increase Effexor XR to 150 mgrs QHS for depression, anxiety Continue Nicotine patch to 14 mg transdermally over 24 hours for nicotine dependence. Change Seroquel to 25  mgrs bid and  400  mg nightly for mood stability and insomnia. Continue  Campral  666 mg p.o.three times daily for alcohol dependence ( CrCl 78.9) Continue Trazodone 50 mg p.o. nightly for insomnia, as needed Discharge Plan in progress - see above   Jenne Campus, MD 01/30/2019, 3:51 PM   Patient ID: Alison Koch, female   DOB: Apr 11, 1985, 33 y.o.   MRN: [id number]

## 2019-01-30 NOTE — [facility] Group Notes (Signed)
[facility] Group Notes:  (Nursing/MHT/Case Management/Adjunct)  Date:  01/30/2019  Time:  9:02 PM  Type of Therapy:  Wrap Up Group  Participation Level:  Minimal  Participation Quality:  Attentive  Affect:  Depressed and Tearful  Cognitive:  Alert  Insight:  Appropriate  Engagement in Group:  Engaged  Modes of Intervention:  Discussion  Summary of Progress/Problems:  Patient tearful in group after phone call.  Patient stated, "My day has been good.  I saw my mom and ate some good food.  I'm thankful for this place because if I were home I would be drinking because I'm tired of my friend's overdosing."    Nash Mantis Denaye 01/30/2019, 9:02 PM

## 2019-01-30 NOTE — Progress Notes (Signed)
Patient ID: Alison Koch, female   DOB: October 15, 1985, 33 y.o.   MRN: [id number]   Pt's mother came during visitation. Pt's mother, Mozelle Remlinger, would like to speak to the social worker regarding the treatment center the pt will be going to after discharge. Please call [phone].

## 2019-01-30 NOTE — Progress Notes (Signed)
[place] NOVEL CORONAVIRUS (COVID-19) DAILY CHECK-OFF SYMPTOMS - answer yes or no to each - every day NO YES  Have you had a fever in the past 24 hours?  . Fever (Temp > 37.80C / 100F) X   Have you had any of these symptoms in the past 24 hours? . New Cough .  Sore Throat  .  Shortness of Breath .  Difficulty Breathing .  Unexplained Body Aches   X   Have you had any one of these symptoms in the past 24 hours not related to allergies?   . Runny Nose .  Nasal Congestion .  Sneezing   X   If you have had runny nose, nasal congestion, sneezing in the past 24 hours, has it worsened?  X   EXPOSURES - check yes or no X   Have you traveled outside the state in the past 14 days?  X   Have you been in contact with someone with a confirmed diagnosis of COVID-19 or PUI in the past 14 days without wearing appropriate PPE?  X   Have you been living in the same home as a person with confirmed diagnosis of COVID-19 or a PUI (household contact)?    X   Have you been diagnosed with COVID-19?    X              What to do next: Answered NO to all: Answered YES to anything:   Proceed with unit schedule Follow the [facility] Inpatient Flowsheet.   

## 2019-01-30 NOTE — Progress Notes (Signed)
   01/30/19 2048  Psych Admission Type (Psych Patients Only)  Admission Status Voluntary  Psychosocial Assessment  Patient Complaints Anxiety  Eye Contact Fair  Facial Expression Animated;Anxious  Affect Anxious;Appropriate to circumstance  Speech Logical/coherent  Interaction Assertive;Attention-seeking  Motor Activity Slow  Appearance/Hygiene Improved;Unremarkable  Behavior Characteristics Cooperative  Mood Anxious  Thought Process  Coherency WDL  Content WDL  Delusions None reported or observed  Perception WDL  Hallucination None reported or observed  Judgment WDL  Confusion None  Danger to Self  Current suicidal ideation? Denies  Self-Injurious Behavior No self-injurious ideation or behavior indicators observed or expressed   Agreement Not to Harm Self Yes  Description of Agreement contracts for safety verbal  Danger to Others  Danger to Others None reported or observed  D: Patient visible in dayroom. Pt upset after talking to a friend over the phone who overdosed.   A: Medications administered as prescribed. Support and encouragement provided as needed.  R: Patient remains safe on the unit. Will continue to monitor for safety and stability.

## 2019-01-30 NOTE — Progress Notes (Signed)
   01/30/19 1800  Psych Admission Type (Psych Patients Only)  Admission Status Voluntary  Psychosocial Assessment  Patient Complaints Anxiety  Eye Contact Fair  Facial Expression Animated;Anxious  Affect Anxious;Appropriate to circumstance  Speech Logical/coherent  Interaction Assertive;Attention-seeking  Motor Activity Slow  Appearance/Hygiene Improved;Unremarkable  Behavior Characteristics Cooperative;Appropriate to situation;Anxious  Mood Anxious  Thought Process  Coherency WDL  Content WDL  Delusions None reported or observed  Perception WDL  Hallucination None reported or observed  Judgment WDL  Confusion None  Danger to Self  Current suicidal ideation? Denies  Self-Injurious Behavior No self-injurious ideation or behavior indicators observed or expressed   Agreement Not to Harm Self Yes  Description of Agreement contracts for safety verbal  Danger to Others  Danger to Others None reported or observed

## 2019-01-31 MED ORDER — QUETIAPINE FUMARATE 25 MG PO TABS
25.0000 mg | ORAL_TABLET | ORAL | Status: DC
  Administered 2019-02-01 – 2019-02-03 (×3): 25 mg via ORAL
  Filled 2019-01-31 (×6): qty 1

## 2019-01-31 MED ORDER — QUETIAPINE FUMARATE 50 MG PO TABS
350.0000 mg | ORAL_TABLET | Freq: Every day | ORAL | Status: DC
  Administered 2019-01-31: 350 mg via ORAL
  Filled 2019-01-31 (×3): qty 1

## 2019-01-31 NOTE — Plan of Care (Signed)
Progress note  D: pt found in bed; compliant with medication administration. Pt denies any physical complaints or pain but is visibly sad/sullen. Pt received troubling news about a former patient. Pt provided support and encouragement and guided to focus on self more, especially while at [facility]. Pt is focused on treatment centers afterward and seems concerned with progress being made by staff. Pt is pleasant. Pt denies si/hi/ah/vh and verbally agrees to approach staff if these become apparent or before harming themself/others while at [place].  A: Pt provided support and encouragement. Pt given medication per protocol and standing orders. Q62m safety checks implemented and continued.  R: Pt safe on the unit. Will continue to monitor.  Pt progressing in the following metrics  Problem: Activity: Goal: Interest or engagement in activities will improve Outcome: Progressing Goal: Sleeping patterns will improve Outcome: Progressing   Problem: Coping: Goal: Ability to verbalize frustrations and anger appropriately will improve Outcome: Progressing Goal: Ability to demonstrate self-control will improve Outcome: Progressing

## 2019-01-31 NOTE — Progress Notes (Signed)
Patient denies any dizziness. 

## 2019-01-31 NOTE — Progress Notes (Signed)
   01/31/19 2200  Psych Admission Type (Psych Patients Only)  Admission Status Voluntary  Psychosocial Assessment  Patient Complaints Anxiety;Worrying  Eye Contact Fair  Facial Expression Anxious;Pensive;Sullen;Sad  Affect Anxious;Depressed;Sad;Sullen  Speech Logical/coherent  Interaction Assertive  Motor Activity Slow  Appearance/Hygiene Unremarkable  Behavior Characteristics Cooperative  Mood Depressed  Thought Process  Coherency WDL  Content WDL  Delusions None reported or observed  Perception WDL  Hallucination None reported or observed  Judgment WDL  Confusion None  Danger to Self  Current suicidal ideation? Denies  Self-Injurious Behavior No self-injurious ideation or behavior indicators observed or expressed   Agreement Not to Harm Self Yes  Description of Agreement contracts for safety verbal  Danger to Others  Danger to Others None reported or observed   Pt concerned that the adjustments to her medications are going to keep her from sleeping. Pt visible in the dayroom

## 2019-01-31 NOTE — Tx Team (Signed)
Interdisciplinary Treatment and Diagnostic Plan Update  01/31/2019 Time of Session: 9:00am Alison Koch MRN: [id number]  Principal Diagnosis: Alcohol dependence with alcohol-induced mood disorder (HCC)  Secondary Diagnoses: Principal Problem:   Alcohol dependence with alcohol-induced mood disorder (HCC) Active Problems:   Severe recurrent major depression with psychotic features (HCC)   Current Medications:  Current Facility-Administered Medications  Medication Dose Route Frequency Provider Last Rate Last Dose  . acamprosate (CAMPRAL) tablet 666 mg  666 mg Oral TID Cobos, Rockey SituFernando A, MD   666 mg at 01/31/19 0805  . alum & mag hydroxide-simeth (MAALOX/MYLANTA) 200-200-20 MG/5ML suspension 30 mL  30 mL Oral Q4H PRN Nira ConnBerry, Jason A, NP   30 mL at 01/28/19 2004  . docusate sodium (COLACE) capsule 200 mg  200 mg Oral Daily Cobos, Rockey SituFernando A, MD   200 mg at 01/28/19 0747  . feeding supplement (ENSURE ENLIVE) (ENSURE ENLIVE) liquid 237 mL  237 mL Oral BID BM Cobos, Rockey SituFernando A, MD   237 mL at 01/31/19 0904  . gabapentin (NEURONTIN) tablet 800 mg  800 mg Oral TID Cobos, Rockey SituFernando A, MD   800 mg at 01/31/19 0804  . hydrOXYzine (ATARAX/VISTARIL) tablet 25 mg  25 mg Oral TID PRN Cobos, Rockey SituFernando A, MD   25 mg at 01/31/19 0804  . magnesium hydroxide (MILK OF MAGNESIA) suspension 30 mL  30 mL Oral Daily PRN Nira ConnBerry, Jason A, NP   30 mL at 01/29/19 2014  . multivitamin with minerals tablet 1 tablet  1 tablet Oral Daily Nira ConnBerry, Jason A, NP   1 tablet at 01/31/19 0804  . nicotine polacrilex (NICORETTE) gum 2 mg  2 mg Oral PRN Cobos, Rockey SituFernando A, MD   2 mg at 01/30/19 1533  . prazosin (MINIPRESS) capsule 1 mg  1 mg Oral QHS Nwoko, Agnes I, NP   1 mg at 01/30/19 2223  . QUEtiapine (SEROQUEL) tablet 25 mg  25 mg Oral BID Cobos, Rockey SituFernando A, MD   25 mg at 01/31/19 0804  . QUEtiapine (SEROQUEL) tablet 400 mg  400 mg Oral QHS Cobos, Rockey SituFernando A, MD   400 mg at 01/30/19 2222  . thiamine (VITAMIN B-1) tablet 100 mg  100  mg Oral Daily Nira ConnBerry, Jason A, NP   100 mg at 01/31/19 0805  . traZODone (DESYREL) tablet 50 mg  50 mg Oral QHS PRN Cobos, Rockey SituFernando A, MD      . venlafaxine XR (EFFEXOR-XR) 24 hr capsule 150 mg  150 mg Oral Q breakfast Cobos, Rockey SituFernando A, MD   150 mg at 01/31/19 [id number]   PTA Medications: Medications Prior to Admission  Medication Sig Dispense Refill Last Dose  . albuterol (VENTOLIN HFA) 108 (90 Base) MCG/ACT inhaler Inhale 2 puffs into the lungs every 6 (six) hours as needed for wheezing or shortness of breath.      . gabapentin (NEURONTIN) 400 MG capsule Take 400 mg by mouth 3 (three) times daily.     . hydrOXYzine (ATARAX/VISTARIL) 50 MG tablet Take 25 mg by mouth 3 (three) times daily as needed for anxiety, itching or nausea.      Marland Kitchen. ibuprofen (ADVIL) 800 MG tablet Take 1 tablet (800 mg total) by mouth 3 (three) times daily. (Patient taking differently: Take 800 mg by mouth every 8 (eight) hours as needed for fever, headache, mild pain, moderate pain or cramping. ) 21 tablet 0   . meclizine (ANTIVERT) 25 MG tablet Take 25 mg by mouth 3 (three) times daily as needed for dizziness.      .Marland Kitchen  Melatonin (MELATONIN MAXIMUM STRENGTH) 5 MG TABS Take 5 mg by mouth at bedtime.     . Multiple Vitamin (MULTI-VITAMIN) tablet Take 1 tablet by mouth daily.     . progesterone (PROMETRIUM) 200 MG capsule Take 200 mg by mouth See admin instructions. Only takes for 10 days out of each month, days vary     . QUEtiapine (SEROQUEL) 100 MG tablet Take 100-150 mg by mouth at bedtime as needed (sleep).      . traZODone (DESYREL) 50 MG tablet Take 50 mg by mouth at bedtime as needed.     . venlafaxine XR (EFFEXOR-XR) 75 MG 24 hr capsule Take 75 mg by mouth daily.     . [DISCONTINUED] thiamine (VITAMIN B-1) 100 MG tablet Take 100 mg by mouth daily.       Patient Stressors: Marital or family conflict Substance abuse  Patient Strengths: Capable of independent living Special hobby/interest Work skills  Treatment  Modalities: Medication Management, Group therapy, Case management,  1 to 1 session with clinician, Psychoeducation, Recreational therapy.   Physician Treatment Plan for Primary Diagnosis: Alcohol dependence with alcohol-induced mood disorder ([place]) Long Term Goal(s): Improvement in symptoms so as ready for discharge Improvement in symptoms so as ready for discharge   Short Term Goals: Ability to identify changes in lifestyle to reduce recurrence of condition will improve Ability to verbalize feelings will improve Ability to disclose and discuss suicidal ideas Ability to demonstrate self-control will improve Ability to identify and develop effective coping behaviors will improve  Medication Management: Evaluate patient's response, side effects, and tolerance of medication regimen.  Therapeutic Interventions: 1 to 1 sessions, Unit Group sessions and Medication administration.  Evaluation of Outcomes: Adequate for Discharge  Physician Treatment Plan for Secondary Diagnosis: Principal Problem:   Alcohol dependence with alcohol-induced mood disorder ([place]) Active Problems:   Severe recurrent major depression with psychotic features ([place])  Long Term Goal(s): Improvement in symptoms so as ready for discharge Improvement in symptoms so as ready for discharge   Short Term Goals: Ability to identify changes in lifestyle to reduce recurrence of condition will improve Ability to verbalize feelings will improve Ability to disclose and discuss suicidal ideas Ability to demonstrate self-control will improve Ability to identify and develop effective coping behaviors will improve     Medication Management: Evaluate patient's response, side effects, and tolerance of medication regimen.  Therapeutic Interventions: 1 to 1 sessions, Unit Group sessions and Medication administration.  Evaluation of Outcomes: Adequate for Discharge   RN Treatment Plan for Primary Diagnosis: Alcohol dependence with  alcohol-induced mood disorder ([place]) Long Term Goal(s): Knowledge of disease and therapeutic regimen to maintain health will improve  Short Term Goals: Ability to identify and develop effective coping behaviors will improve and Compliance with prescribed medications will improve  Medication Management: RN will administer medications as ordered by provider, will assess and evaluate patient's response and provide education to patient for prescribed medication. RN will report any adverse and/or side effects to prescribing provider.  Therapeutic Interventions: 1 on 1 counseling sessions, Psychoeducation, Medication administration, Evaluate responses to treatment, Monitor vital signs and CBGs as ordered, Perform/monitor CIWA, COWS, AIMS and Fall Risk screenings as ordered, Perform wound care treatments as ordered.  Evaluation of Outcomes: Adequate for Discharge   LCSW Treatment Plan for Primary Diagnosis: Alcohol dependence with alcohol-induced mood disorder ([place]) Long Term Goal(s): Safe transition to appropriate next level of care at discharge, Engage patient in therapeutic group addressing interpersonal concerns.  Short Term Goals: Engage  patient in aftercare planning with referrals and resources, Increase social support, Increase emotional regulation, Identify triggers associated with mental health/substance abuse issues and Increase skills for wellness and recovery  Therapeutic Interventions: Assess for all discharge needs, 1 to 1 time with Social worker, Explore available resources and support systems, Assess for adequacy in community support network, Educate family and significant other(s) on suicide prevention, Complete Psychosocial Assessment, Interpersonal group therapy.  Evaluation of Outcomes: Adequate for Discharge   Progress in Treatment: Attending groups: Yes. Participating in groups: Yes. Taking medication as prescribed: Yes. Toleration medication: Yes.  Family/Significant other  contact made: Yes, individual(s) contacted:  mother, Marcelino Duster Patient understands diagnosis: Yes. Discussing patient identified problems/goals with staff: Yes. Medical problems stabilized or resolved: No. Denies suicidal/homicidal ideation: Yes. Issues/concerns per patient self-inventory: Yes.  New problem(s) identified: Yes, Describe:  limited social supports.  New Short Term/Long Term Goal(s): detox, medication management for mood stabilization; elimination of SI thoughts; development of comprehensive mental wellness/sobriety plan.  Patient Goals:  "Get my anxiety under control, I know it effects my insomnia."  Discharge Plan or Barriers: Patient has repeatedly declined referrals for residential substance use treatment. She was expected to start PHP this morning, this will be rescheduled for patient.   Reason for Continuation of Hospitalization: Anxiety Depression  Estimated Length of Stay: TBD  Attendees: Patient: Taneka Espiritu 01/31/2019 9:09 AM  Physician: Marguerita Merles; Dr. Nehemiah Massed, MD 01/31/2019 9:09 AM  Nursing: Meriam Sprague RN; Casimiro Needle.Kathie Rhodes, RN 01/31/2019 9:09 AM  RN Care Manager: 01/31/2019 9:09 AM  Social Worker: Enid Cutter, LCSWA; [place], [place] 01/31/2019 9:09 AM  Recreational Therapist:  01/31/2019 9:09 AM  Other: Marciano Sequin, NP 01/31/2019 9:09 AM  Other:  01/31/2019 9:09 AM  Other: 01/31/2019 9:09 AM    Scribe for Treatment Team: Maeola Sarah, LCSWA 01/31/2019 9:09 AM

## 2019-01-31 NOTE — Progress Notes (Signed)
[address] Update:  CSW received a phone call from [facility] regarding a referral made for the patient. According to admissions, the patient was declined due to her insurance provider being "out of network".   Admissions reports that if the patient wanted to do "self pay" for a 30 day residential stay, the  average cost is $30,000. They also shared that there is a two week wait list for any bed availability at this time if the patient chose to come to their facility.    [place] Update:  CSW contacted [employer] [phone], option 1) to determine if their was an update regarding the patient meeting criteria for admission.   According to Legrand Como in admissions, the patient's insurance continues to be "out of network". He reports that the patient's insurance provider [facility] [place]) has not contacted the facility regarding a "Single Case Letter" that would need to be reviewed by [place] department. Legrand Como shared with CSW that the patient would more than likely be declined again, unless she was able to met the self pay rate.  Patient was declined at [facility] on 01/29/19. Patient was declined due behavioral issues such as self harming and suicidal ideation.   ______________________________________________________________________________   CSW spoke with the patient's mother, Sharyn Lull [phone]) regarding patient's care and discharge planning. Patient's mother shared that she planned on contacting [employer] with [facility] on a "three way call" to determine if the insurance company will cover the patient's treatment. CSW shared the information stated above to the patient's mother as well regarding obstacles with referrals made so far.   CSW explained to the patient's mother, that the doctor has decided that the patient is adequate for discharge while awaiting potential placement and that the patient was  scheduled for discharge Saturday or Sunday. CSW informed the patient's mother of the patient's follow up with [facility] PHP starting 02/03/19. Patient's mother expressed understanding and stated she would contact CSW with an update after she spoke with [place].   CSW will continue to follow.    Radonna Ricker, MSW, LCSW Clinical Social Worker [facility]  Phone: [phone]

## 2019-01-31 NOTE — Progress Notes (Signed)
[facility] MD Progress Note  01/31/2019 2:25 PM Cheryle Dark  MRN:  [id number] Subjective: Reports ongoing anxiety.  Describes having had recent self-injurious ideations earlier this admission related to mainly to anxiety, today denies any suicidal ideations and contracts for safety.  Also presents future oriented. Denies medication side effects.  Endorses improving but not completely resolved thoughts of drinking/cravings for alcohol.   Objective: I have reviewed chart notes and met with patient. With patient's expressed consent I have also spoken with patient's mother via phone, along with [place], [place].   33 year old single female, lives alone, presented voluntarily on 11/15 reporting SI, triggered by an argument with her mother.  Reported chronic depression which had been worsening recently, neurovegetative symptoms, heavy/regular drinking.  Admission BAL was 442.  Endorsed past history of polysubstance abuse (cocaine, opiates, NMDA) but not in several years.  Patient presents alert, attentive, vaguely anxious, without psychomotor agitation or restlessness.  Currently focused on discharge planning issues.  Expresses that she wants to go to residential rehab setting at discharge, describes anxiety and apprehension about returning home, as has limited support network/lives alone and states she would be at high risk of relapsing and decompensating again. Mother also expresses some concerns.  States , referring to patient "she is so motivated right now and I think if she goes back to drinking she is going to die".  Mother states that she has had several conversations with patient's health insurance as well as with staff at [facility].  States [facility] has offered a bed, pending insurance clearance.  Mother states it has been harder to communicate with these parties due to holiday weekend but that she is hoping a bed will be available for patient on Monday. CSW/team has been working on disposition  planning.  At mother's request letter was written for patient's insurance confirming that rehab would be an appropriate/optimal disposition plan.  She was also referred to [facility] but has been declined.  No disruptive or agitated behaviors on unit. Denies medication side effects. BPs have been trending low with some orthostasis.  Patient denies significant dizziness or lightheadedness at this time.  Principal Problem: Alcohol dependence with alcohol-induced mood disorder (HCC) Diagnosis: Principal Problem:   Alcohol dependence with alcohol-induced mood disorder (HCC) Active Problems:   Severe recurrent major depression with psychotic features ([place])  Total Time spent with patient: 20 minutes  Past Psychiatric History: See admission H&P  Past Medical History:  Past Medical History:  Diagnosis Date  . Lupus ([place])    History reviewed. No pertinent surgical history. Family History: History reviewed. No pertinent family history. Family Psychiatric  History: See admission H&P Social History:  Social History   Substance and Sexual Activity  Alcohol Use None     Social History   Substance and Sexual Activity  Drug Use Not on file    Social History   Socioeconomic History  . Marital status: Single    Spouse name: Not on file  . Number of children: Not on file  . Years of education: Not on file  . Highest education level: Not on file  Occupational History  . Not on file  Social Needs  . Financial resource strain: Not on file  . Food insecurity    Worry: Not on file    Inability: Not on file  . Transportation needs    Medical: Not on file    Non-medical: Not on file  Tobacco Use  . Smoking status: Not on file  Substance and  Sexual Activity  . Alcohol use: Not on file  . Drug use: Not on file  . Sexual activity: Not on file  Lifestyle  . Physical activity    Days per week: Not on file    Minutes per session: Not on file  . Stress: Not on file  Relationships   . Social Herbalist on phone: Not on file    Gets together: Not on file    Attends religious service: Not on file    Active member of club or organization: Not on file    Attends meetings of clubs or organizations: Not on file    Relationship status: Not on file  Other Topics Concern  . Not on file  Social History Narrative  . Not on file   Additional Social History:   Sleep: Fair  Appetite: Improving  Current Medications: Current Facility-Administered Medications  Medication Dose Route Frequency Provider Last Rate Last Dose  . acamprosate (CAMPRAL) tablet 666 mg  666 mg Oral TID Cobos, Myer Peer, MD   666 mg at 01/31/19 1125  . alum & mag hydroxide-simeth (MAALOX/MYLANTA) 200-200-20 MG/5ML suspension 30 mL  30 mL Oral Q4H PRN Lindon Romp A, NP   30 mL at 01/28/19 2004  . docusate sodium (COLACE) capsule 200 mg  200 mg Oral Daily Cobos, Myer Peer, MD   200 mg at 01/28/19 0747  . feeding supplement (ENSURE ENLIVE) (ENSURE ENLIVE) liquid 237 mL  237 mL Oral BID BM Cobos, Myer Peer, MD   237 mL at 01/31/19 0904  . gabapentin (NEURONTIN) tablet 800 mg  800 mg Oral TID Cobos, Fernando A, MD   800 mg at 01/31/19 1125  . hydrOXYzine (ATARAX/VISTARIL) tablet 25 mg  25 mg Oral TID PRN Cobos, Myer Peer, MD   25 mg at 01/31/19 0804  . magnesium hydroxide (MILK OF MAGNESIA) suspension 30 mL  30 mL Oral Daily PRN Lindon Romp A, NP   30 mL at 01/29/19 2014  . multivitamin with minerals tablet 1 tablet  1 tablet Oral Daily Lindon Romp A, NP   1 tablet at 01/31/19 0804  . nicotine polacrilex (NICORETTE) gum 2 mg  2 mg Oral PRN Cobos, Myer Peer, MD   2 mg at 01/30/19 1533  . prazosin (MINIPRESS) capsule 1 mg  1 mg Oral QHS Nwoko, Agnes I, NP   1 mg at 01/30/19 2223  . QUEtiapine (SEROQUEL) tablet 25 mg  25 mg Oral BID Cobos, Myer Peer, MD   25 mg at 01/31/19 0804  . QUEtiapine (SEROQUEL) tablet 400 mg  400 mg Oral QHS Cobos, Myer Peer, MD   400 mg at 01/30/19 2222  . thiamine  (VITAMIN B-1) tablet 100 mg  100 mg Oral Daily Lindon Romp A, NP   100 mg at 01/31/19 0805  . traZODone (DESYREL) tablet 50 mg  50 mg Oral QHS PRN Cobos, Myer Peer, MD      . venlafaxine XR (EFFEXOR-XR) 24 hr capsule 150 mg  150 mg Oral Q breakfast Cobos, Myer Peer, MD   150 mg at 01/31/19 0623    Lab Results: No results found for this or any previous visit (from the past 80 hour(s)).  Blood Alcohol level:  Lab Results  Component Value Date   ETH 442 [facility]) 01/19/2019   ETH 474 (HH) 76/28/3151    Metabolic Disorder Labs: Lab Results  Component Value Date   HGBA1C 4.9 01/21/2019   MPG 93.93 01/21/2019   No results  found for: PROLACTIN Lab Results  Component Value Date   CHOL 189 01/21/2019   TRIG 82 01/21/2019   HDL 124 01/21/2019   CHOLHDL 1.5 01/21/2019   VLDL 16 01/21/2019   LDLCALC 49 01/21/2019    Physical Findings: AIMS: Facial and Oral Movements Muscles of Facial Expression: None, normal Lips and Perioral Area: None, normal Jaw: None, normal Tongue: None, normal,Extremity Movements Upper (arms, wrists, hands, fingers): None, normal Lower (legs, knees, ankles, toes): None, normal, Trunk Movements Neck, shoulders, hips: None, normal, Overall Severity Severity of abnormal movements (highest score from questions above): None, normal Incapacitation due to abnormal movements: None, normal Patient's awareness of abnormal movements (rate only patient's report): No Awareness, Dental Status Current problems with teeth and/or dentures?: No Does patient usually wear dentures?: No  CIWA:  CIWA-Ar Total: 1 COWS:  COWS Total Score: 2  Musculoskeletal: Strength & Muscle Tone: within normal limits Gait & Station: normal Patient leans: N/A  Psychiatric Specialty Exam: Physical Exam  Nursing note and vitals reviewed. Constitutional: She is oriented to person, place, and time. She appears well-developed and well-nourished.  HENT:  Head: Normocephalic and atraumatic.   Respiratory: Effort normal.  Neurological: She is alert and oriented to person, place, and time.    ROS no chest pain, no shortness of breath, no vomiting.  Blood pressure (!) 77/57, pulse (!) 105, temperature 98.2 F (36.8 C), temperature source Oral, resp. rate 19, height '5\' 5"'$  (1.651 m), weight 62.1 kg, SpO2 96 %.Body mass index is 22.8 kg/m.  General Appearance: Casual  Eye Contact:  Good  Speech:  Normal Rate  Volume:  Normal  Mood:  Anxious  Affect:  Anxious and vaguely constricted  Thought Process:  Coherent and Descriptions of Associations: Intact  Orientation:  Full (Time, Place, and Person)  Thought Content: Ruminative, no hallucinations, no delusions  Suicidal Thoughts: No current no current suicidal ideations, contracts for safety on unit  Homicidal Thoughts:  No  Memory:  Recent and remote grossly intact  Judgement:  improving  Insight:  Fair/improving  Psychomotor Activity:  Normal-no current psychomotor restlessness  Concentration:  Concentration: Good and Attention Span: Good  Recall:  Good  Fund of Knowledge:  Good  Language:  Good  Akathisia:  Negative  Handed:  Right  AIMS (if indicated):     Assets:  Desire for Improvement Housing Resilience  ADL's:  Intact  Cognition:  WNL  Sleep:  Number of Hours: 5.25   Assessment:  33 year old single female, lives alone, presented voluntarily on 11/15 reporting SI, triggered by an argument with her mother.  Reported chronic depression which had been worsening recently, neurovegetative symptoms, heavy/regular drinking.  Admission BAL was 442.  Endorsed past history of polysubstance abuse (cocaine, opiates, NMDA) but not in several years.  Patient describes increased anxiety/apprehension as she approaches discharge.  In particular describes concern about returning home, stating she fears she might relapse/decompensate in this setting.  She is motivated in sobriety at this time and focused on getting accepted into rehab.   Mother has also been very involved and echoes this concern, reports has been in contact with insurance and [employer], trying to coordinate bed availability.  Tolerating medications well, although BP is trending low, with some orthostatic changes.  Currently denies dizziness or lightheadedness.  States she is sleeping better.  Reports she slept poorly after Seroquel was tapered down to 300 mg nightly, and has been tolerating this medication well.  Will discontinue Minipress, trazodone to minimize orthostatic hypotension  and decrease Seroquel dose.   Treatment Plan Summary: Treatment plan reviewed as below today 11/27 Encourage group and milieu participation Encourage efforts to work on sobriety Continue Neurontin  800 mg p.o. 3 times daily for anxiety. Continue Hydroxyzine 50 mg p.o. 3 times daily as needed anxiety. D/C Minipress to minimize orthostatic hypotension. Continue Effexor XR to 150 mgrs QHS for depression, anxiety Continue Nicotine patch to 14 mg transdermally over 24 hours for nicotine dependence. Decrease  Seroquel to 25  mgrs bid and  400  mg nightly for mood stability and insomnia. Continue  Campral  666 mg p.o.three times daily for alcohol dependence ( CrCl 78.9) D/C Trazodone to minimize orthostatic hypotension Encourage PO fluids. Monitor vitals Q 8 hours  Discharge Plan in progress - see above. Team working on referral to Rehab setting.     Jenne Campus, MD 01/31/2019, 2:25 PM   Patient ID: Leeanne Deed, female   DOB: 12-13-1985, 33 y.o.   MRN: [id number]

## 2019-01-31 NOTE — Progress Notes (Signed)
Recreation Therapy Notes  Date:  11.27.20 Time: 0930 Location: [address] Dayroom  Group Topic: Stress Management  Goal Area(s) Addresses:  Patient will identify positive stress management techniques. Patient will identify benefits of using stress management post d/c.  Behavioral Response:  Engaged  Intervention: Stress Management  Activity :  Progressive Muscle Relaxation.  LRT introduced the stress management technique of progressive muscle relaxation.  LRT read a script that guided patients in tensing then relaxing each muscle group.  Patients were to follow along as script was read to engage in activity.  Education:  Stress Management, Discharge Planning.   Education Outcome: Acknowledges Education  Clinical Observations/Feedback: Pt attended and participated in activity.     Victorino Sparrow, LRT/CTRS         Shonita, Rinck A 01/31/2019 11:23 AM

## 2019-02-01 LAB — BASIC METABOLIC PANEL
Anion gap: 11 (ref 5–15)
BUN: 10 mg/dL (ref 6–20)
CO2: 35 mmol/L — ABNORMAL HIGH (ref 22–32)
Calcium: 9.9 mg/dL (ref 8.9–10.3)
Chloride: 92 mmol/L — ABNORMAL LOW (ref 98–111)
Creatinine, Ser: 0.85 mg/dL (ref 0.44–1.00)
GFR calc Af Amer: >60 mL/min (ref >60)
GFR calc non Af Amer: >60 mL/min (ref >60)
Glucose, Bld: 96 mg/dL (ref 70–99)
Potassium: 3.3 mmol/L — ABNORMAL LOW (ref 3.5–5.1)
Sodium: 138 mmol/L (ref 135–145)

## 2019-02-01 MED ORDER — POTASSIUM CHLORIDE CRYS ER 10 MEQ PO TBCR
10.0000 meq | EXTENDED_RELEASE_TABLET | Freq: Two times a day (BID) | ORAL | Status: AC
  Administered 2019-02-01 – 2019-02-02 (×3): 10 meq via ORAL
  Filled 2019-02-01 (×5): qty 1

## 2019-02-01 MED ORDER — NALTREXONE HCL 50 MG PO TABS
25.0000 mg | ORAL_TABLET | Freq: Every day | ORAL | Status: DC
  Administered 2019-02-01 – 2019-02-02 (×2): 25 mg via ORAL
  Filled 2019-02-01 (×5): qty 1

## 2019-02-01 MED ORDER — QUETIAPINE FUMARATE 400 MG PO TABS
400.0000 mg | ORAL_TABLET | Freq: Every day | ORAL | Status: DC
  Administered 2019-02-01 – 2019-02-02 (×2): 400 mg via ORAL
  Filled 2019-02-01 (×4): qty 1
  Filled 2019-02-01: qty 2

## 2019-02-01 NOTE — Progress Notes (Signed)
[place] NOVEL CORONAVIRUS (COVID-19) DAILY CHECK-OFF SYMPTOMS - answer yes or no to each - every day NO YES  Have you had a fever in the past 24 hours?  . Fever (Temp > 37.80C / 100F) X   Have you had any of these symptoms in the past 24 hours? . New Cough .  Sore Throat  .  Shortness of Breath .  Difficulty Breathing .  Unexplained Body Aches   X   Have you had any one of these symptoms in the past 24 hours not related to allergies?   . Runny Nose .  Nasal Congestion .  Sneezing   X   If you have had runny nose, nasal congestion, sneezing in the past 24 hours, has it worsened?  X   EXPOSURES - check yes or no X   Have you traveled outside the state in the past 14 days?  X   Have you been in contact with someone with a confirmed diagnosis of COVID-19 or PUI in the past 14 days without wearing appropriate PPE?  X   Have you been living in the same home as a person with confirmed diagnosis of COVID-19 or a PUI (household contact)?    X   Have you been diagnosed with COVID-19?    X              What to do next: Answered NO to all: Answered YES to anything:   Proceed with unit schedule Follow the [facility] Inpatient Flowsheet.   

## 2019-02-01 NOTE — Progress Notes (Signed)
[facility] Group Notes:  (Nursing/MHT/Case Management/Adjunct)  Date:  02/01/2019  Time:  2030  Type of Therapy:  wrap up group  Participation Level:  Active  Participation Quality:  Appropriate, Attentive, Sharing and Supportive  Affect:  Appropriate  Cognitive:  Appropriate  Insight:  Improving  Engagement in Group:  Engaged  Modes of Intervention:  Clarification, Education and Support  Summary of Progress/Problems:  Alison Koch, Alison Koch 02/01/2019, 9:16 PM
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
## 2019-02-01 NOTE — Progress Notes (Signed)
D. Pt presents with and anxious affect/ mood- observed in the dayroom interacting well with peers - per pt's self inventory, pt rated her depression, hopelessness and anxiety an 8/7/9, respectively. Pt currently denies SI/HI and AVH A. Labs and vitals monitored. Pt compliant with medications. Pt supported emotionally and encouraged to express concerns and ask questions.   R. Pt remains safe with 15 minute checks. Will continue POC.

## 2019-02-01 NOTE — Progress Notes (Signed)
[facility] MD Progress Note  02/01/2019 11:29 AM Alison Koch  MRN:  [id number] Subjective: Reports feeling "about the same" and describes feeling "kind of depressed" although acknowledges partial improvement since admission.  She remains future oriented, focusing on disposition planning options, as related to her discharge goal of going to a rehab rather than returning home.  States did not sleep as well yesterday evening, with more interrupted/less restful sleep. Currently does not endorse medication side effects. Remains motivated in sobriety.  She does describe some ongoing cravings for alcohol.   Objective: I have reviewed chart notes and met with patient. 33 year old single female, lives alone, presented voluntarily on 11/15 reporting SI, triggered by an argument with her mother.  Reported chronic depression which had been worsening recently, neurovegetative symptoms, heavy/regular drinking.  Admission BAL was 442.  Endorsed past history of polysubstance abuse (cocaine, opiates, NMDA) but not in several years.  Patient presents vaguely dysphoric, constricted affect.  Affect does improve partially during session.  Currently denies suicidal ideation/plan or intent. She continues to focus on discharge/disposition planning.  In brief, states that she would be at high risk of relapse should she return home, explains that she lives alone with limited support system and that although she remains highly motivated in sobriety she does continue to have cravings for alcohol, although to a lesser degree on Campral.  Due to this she is invested/focused on going to a rehab setting at discharge.  This is being worked on by [employer] as well as patient's mother who has been involved in treatment.  At this time it appears that [facility] ([place] may be an option.  Reports sleep was less restful and more interrupted yesterday, following discontinuation of trazodone/Minipress and decreasing Seroquel dose.  These  medication changes were made due to orthostatic hypotension. Today patient's BP is improved-88/63 with pulse of 78.  States she has been drinking fluids, eating more, and denies any dizziness or lightheadedness. Visible in dayroom, interacting with peers appropriately.  As per staff notes continues  to endorse depression/anxiety. Denies SI. Labs reviewed-potassium 3.3, creatinine 0.85, BUN 10, serum glucose 96 Principal Problem: Alcohol dependence with alcohol-induced mood disorder (HCC) Diagnosis: Principal Problem:   Alcohol dependence with alcohol-induced mood disorder (HCC) Active Problems:   Severe recurrent major depression with psychotic features ([place])  Total Time spent with patient: 20 minutes  Past Psychiatric History: See admission H&P  Past Medical History:  Past Medical History:  Diagnosis Date  . Lupus ([place])    History reviewed. No pertinent surgical history. Family History: History reviewed. No pertinent family history. Family Psychiatric  History: See admission H&P Social History:  Social History   Substance and Sexual Activity  Alcohol Use None     Social History   Substance and Sexual Activity  Drug Use Not on file    Social History   Socioeconomic History  . Marital status: Single    Spouse name: Not on file  . Number of children: Not on file  . Years of education: Not on file  . Highest education level: Not on file  Occupational History  . Not on file  Social Needs  . Financial resource strain: Not on file  . Food insecurity    Worry: Not on file    Inability: Not on file  . Transportation needs    Medical: Not on file    Non-medical: Not on file  Tobacco Use  . Smoking status: Not on file  Substance and Sexual Activity  .  Alcohol use: Not on file  . Drug use: Not on file  . Sexual activity: Not on file  Lifestyle  . Physical activity    Days per week: Not on file    Minutes per session: Not on file  . Stress: Not on file  Relationships   . Social Herbalist on phone: Not on file    Gets together: Not on file    Attends religious service: Not on file    Active member of club or organization: Not on file    Attends meetings of clubs or organizations: Not on file    Relationship status: Not on file  Other Topics Concern  . Not on file  Social History Narrative  . Not on file   Additional Social History:   Sleep: Fair  Appetite: Improving  Current Medications: Current Facility-Administered Medications  Medication Dose Route Frequency Provider Last Rate Last Dose  . acamprosate (CAMPRAL) tablet 666 mg  666 mg Oral TID Mattie Nordell, Myer Peer, MD   666 mg at 02/01/19 0810  . alum & mag hydroxide-simeth (MAALOX/MYLANTA) 200-200-20 MG/5ML suspension 30 mL  30 mL Oral Q4H PRN Lindon Romp A, NP   30 mL at 01/28/19 2004  . docusate sodium (COLACE) capsule 200 mg  200 mg Oral Daily Ryliee Figge, Myer Peer, MD   200 mg at 01/28/19 0747  . feeding supplement (ENSURE ENLIVE) (ENSURE ENLIVE) liquid 237 mL  237 mL Oral BID BM Chevie Birkhead, Myer Peer, MD   237 mL at 02/01/19 0931  . gabapentin (NEURONTIN) tablet 800 mg  800 mg Oral TID Maximino Cozzolino, Myer Peer, MD   800 mg at 02/01/19 0810  . hydrOXYzine (ATARAX/VISTARIL) tablet 25 mg  25 mg Oral TID PRN Myleen Brailsford, Myer Peer, MD   25 mg at 02/01/19 0810  . magnesium hydroxide (MILK OF MAGNESIA) suspension 30 mL  30 mL Oral Daily PRN Lindon Romp A, NP   30 mL at 01/29/19 2014  . multivitamin with minerals tablet 1 tablet  1 tablet Oral Daily Lindon Romp A, NP   1 tablet at 02/01/19 0811  . nicotine polacrilex (NICORETTE) gum 2 mg  2 mg Oral PRN Tiffany Calmes, Myer Peer, MD   2 mg at 02/01/19 0848  . QUEtiapine (SEROQUEL) tablet 25 mg  25 mg Oral BH-q7a Sharnette Kitamura, Myer Peer, MD   25 mg at 02/01/19 0610  . QUEtiapine (SEROQUEL) tablet 350 mg  350 mg Oral QHS Yader Criger, Myer Peer, MD   350 mg at 01/31/19 2212  . thiamine (VITAMIN B-1) tablet 100 mg  100 mg Oral Daily Lindon Romp A, NP   100 mg at 02/01/19 0813  .  venlafaxine XR (EFFEXOR-XR) 24 hr capsule 150 mg  150 mg Oral Q breakfast Eris Breck, Myer Peer, MD   150 mg at 02/01/19 4196    Lab Results:  Results for orders placed or performed during the hospital encounter of 01/20/19 (from the past 48 hour(s))  Basic metabolic panel     Status: Abnormal   Collection Time: 02/01/19  6:14 AM  Result Value Ref Range   Sodium 138 135 - 145 mmol/L   Potassium 3.3 (L) 3.5 - 5.1 mmol/L   Chloride 92 (L) 98 - 111 mmol/L   CO2 35 (H) 22 - 32 mmol/L   Glucose, Bld 96 70 - 99 mg/dL   BUN 10 6 - 20 mg/dL   Creatinine, Ser 0.85 0.44 - 1.00 mg/dL   Calcium 9.9 8.9 - 10.3 mg/dL  GFR calc non Af Amer >60 >60 mL/min   GFR calc Af Amer >60 >60 mL/min   Anion gap 11 5 - 15    Comment: Performed at [facility], [place] [address], [place], [place] [address]    Blood Alcohol level:  Lab Results  Component Value Date   ETH 442 [facility]) 01/19/2019   ETH 474 (HH) 16/38/4665    Metabolic Disorder Labs: Lab Results  Component Value Date   HGBA1C 4.9 01/21/2019   MPG 93.93 01/21/2019   No results found for: PROLACTIN Lab Results  Component Value Date   CHOL 189 01/21/2019   TRIG 82 01/21/2019   HDL 124 01/21/2019   CHOLHDL 1.5 01/21/2019   VLDL 16 01/21/2019   LDLCALC 49 01/21/2019    Physical Findings: AIMS: Facial and Oral Movements Muscles of Facial Expression: None, normal Lips and Perioral Area: None, normal Jaw: None, normal Tongue: None, normal,Extremity Movements Upper (arms, wrists, hands, fingers): None, normal Lower (legs, knees, ankles, toes): None, normal, Trunk Movements Neck, shoulders, hips: None, normal, Overall Severity Severity of abnormal movements (highest score from questions above): None, normal Incapacitation due to abnormal movements: None, normal Patient's awareness of abnormal movements (rate only patient's report): No Awareness, Dental Status Current problems with teeth and/or dentures?: No Does patient  usually wear dentures?: No  CIWA:  CIWA-Ar Total: 1 COWS:  COWS Total Score: 2  Musculoskeletal: Strength & Muscle Tone: within normal limits Gait & Station: normal Patient leans: N/A  Psychiatric Specialty Exam: Physical Exam  Nursing note and vitals reviewed. Constitutional: She is oriented to person, place, and time. She appears well-developed and well-nourished.  HENT:  Head: Normocephalic and atraumatic.  Respiratory: Effort normal.  Neurological: She is alert and oriented to person, place, and time.    ROS no chest pain, no shortness of breath, no vomiting.  Blood pressure (!) 88/63, pulse 78, temperature 97.6 F (36.4 C), temperature source Oral, resp. rate 19, height _0  (1.651 m), weight 62.1 kg, SpO2 96 %.Body mass index is 22.8 kg/m.  General Appearance: Casual  Eye Contact:  Good  Speech:  Normal Rate  Volume:  Normal  Mood:  Partially improved compared to admission  Affect:  Constricted/anxious but tends to improve partially during session, smiles appropriately at times during session  Thought Process:  Linear and Descriptions of Associations: Intact  Orientation:  Full (Time, Place, and Person)  Thought Content:  no hallucinations, no delusions, not internally preoccupied  Suicidal Thoughts: Denies current suicidal or self-injurious ideations and contracts for safety on unit, no homicidal or violent ideations  Homicidal Thoughts:  No  Memory:  Recent and remote grossly intact  Judgement:  improving  Insight:  Fair/improving  Psychomotor Activity:  Normal-no current psychomotor restlessness  Concentration:  Concentration: Good and Attention Span: Good  Recall:  Good  Fund of Knowledge:  Good  Language:  Good  Akathisia:  Negative  Handed:  Right  AIMS (if indicated):     Assets:  Desire for Improvement Housing Resilience  ADL's:  Intact  Cognition:  WNL  Sleep:  Number of Hours: 5.25   Assessment:  33 year old single female, lives alone, presented  voluntarily on 11/15 reporting SI, triggered by an argument with her mother.  Reported chronic depression which had been worsening recently, neurovegetative symptoms, heavy/regular drinking.  Admission BAL was 442.  Endorsed past history of polysubstance abuse (cocaine, opiates, NMDA) but not in several years.  Patient remains vaguely depressed, anxious although endorsing partial  improvement compared to admission.  Today denies suicidal ideations/contracts for safety on unit.  Presents future oriented and continues to focus on being discharged to a residential rehab setting, rather than returning home. States her BPs tend to run low.  Today BP improved to 88/63, without tachycardia, no dizziness or lightheadedness reported or endorsed.  She is drinking fluids.  Treatment team is working towards disposition goal.  Patient states she thinks she will be accepted to [facility]. Describes partially improved but persistent cravings , states Campral has been well-tolerated and partially helpful.  She is interested in adding Naltrexone to further assist in her efforts to maintain sobriety/abstain from alcohol.  She is not taking any opiates and is aware of opiate blocking properties.  Treatment Plan Summary: Treatment plan reviewed as below today 11/28 Encourage group and milieu participation Encourage efforts to work on sobriety Continue Neurontin  800 mg p.o. 3 times daily for anxiety. Continue Hydroxyzine 50 mg p.o. 3 times daily as needed anxiety. Continue Effexor XR 150 mgrs QHS for depression, anxiety Continue Nicotine patch to 14 mg transdermally over 24 hours for nicotine dependence. Continue Seroquel  25  mgrs bid and  400  mg nightly for mood stability and insomnia. Start Naltrexone 25 mgrs QDAY for alcohol dependence Continue  Campral  666 mg p.o.three times daily for alcohol dependence ( CrCl 78.9) Continue to encourage PO fluids. Monitor vitals Q 8 hours  Discharge is for in  progress - see above. Team working on referral to Rehab setting.     Jenne Campus, MD 02/01/2019, 11:29 AM   Patient ID: Alison Koch, female   DOB: 1986-01-14, 33 y.o.   MRN: [id number]

## 2019-02-02 MED ORDER — NALTREXONE HCL 50 MG PO TABS
50.0000 mg | ORAL_TABLET | Freq: Every day | ORAL | Status: DC
  Administered 2019-02-03: 08:00:00 50 mg via ORAL
  Filled 2019-02-02 (×3): qty 1

## 2019-02-02 NOTE — Progress Notes (Addendum)
D. Pt observed in the milieu interacting appropriately with peers and staff- is friendly during interactions. Per pt's self inventory, pt rated her depression, hopelessness and anxiety a 7/9/8, respectively. Pt writes that her goal today is "relax, read and meditate", and writes that she will " find a quiet place " to help her to meet that goal".Pt currently denies SI/HI and AVH A. Labs and vitals monitored. Pt compliant with medications. Pt supported emotionally and encouraged to express concerns and ask questions.   R. Pt remains safe with 15 minute checks. Will continue POC.

## 2019-02-02 NOTE — [facility] Group Notes (Signed)
[facility] LCSW Group Therapy Note  Date/Time:  02/02/2019  10:00AM-11:00PM  Type of Therapy and Topic:  Group Therapy:  Music's Effect on Depression and Anxiety  Participation Level:  Active   Description of Group: In this process group, members discussed what types of music trigger them to worsening mental health symptoms and what they can do about this in the future.  For instance, we discussed what to do when riding in a friend's car and a song harmful to the patient starts playing.  We also discussed how music can be used as a tool to help with wellness and recovery in various ways including managing depression and anxiety as well as encouraging healthy sleep habits and avoiding relapse into old negative behaviors such as drinking, self-harming, or staying in bed all day.  Five songs were played as examples, including "Tell Your Heart To Beat Again," "Inward Journey," "Stand," "I Am Enough," and "The Fight Song."  Comments were elicited which showed the group to be in agreement that the songs were quite relatable and have the potential to help them outside of the hospital setting.  Therapeutic Goals: 1. Patients will be introduced to several specific songs that can relate to self-image and self-love in a helpful way. 2. Patients will explore the impact of different pieces of music on their feelings, i.e. uplifting, triggering, etc. 3. Patients will discuss how to use this self-knowledge to assist in recovery.  Summary of Patient Progress:  At the beginning of group, patient expressed that screamo music is triggering for her and makes her anxious, while reggae music calms her down.  She particularly asked that we not listen to any music by Vanetta Mulders, stating that on two occasions she overdosed to that music and it is very triggering for her.  By the end of group she was able to relate to virtually all the music, and showed insight.  To one song that she did not like because it made her have deep  feelings, she stated she did not like it and that might mean she should actually listen to it because she does not like feeling her feelings, but needs to do so..  Therapeutic Modalities: Solution Focused Brief Therapy Activity   Selmer Dominion, LCSW

## 2019-02-02 NOTE — Progress Notes (Signed)
[facility] MD Progress Note  02/02/2019 3:39 PM Alison Koch  MRN:  [id number] Subjective: Reports feeling " about the same". Does report she slept better last night. Denies medication side effects, and has tolerated Naltrexone trial well thus far , without side effects. Denies suicidal ideations at present and continues to present future oriented, hoping to be accepted to a residential rehab setting .  Objective: I have reviewed chart notes and met with patient. 33 year old single female, lives alone, presented voluntarily on 11/15 reporting SI, triggered by an argument with her mother.  Reported chronic depression which had been worsening recently, neurovegetative symptoms, heavy/regular drinking.  Admission BAL was 442.  Endorsed past history of polysubstance abuse (cocaine, opiates, NMDA) but not in several years.  Patient presents alert, attentive, reports feeling " the same". Affect is noted to be improving more reactive. Remains future oriented and hoping to go to a rehab setting at discharge. States her mother has been told that she will most likely have a bed available at [facility] tomorrow. Visible in day room, behavior on unit in good control, no agitation or restlessness noted, polite on approach. Tolerating medications well . Reported persistent thoughts of drinking/cravings in spite of Campral and agreed to add Naltrexone in an effort to help sobriety/abstinence efforts. She does not take any opiate medications. Thus far has tolerated Naltrexone well .   Principal Problem: Alcohol dependence with alcohol-induced mood disorder (HCC) Diagnosis: Principal Problem:   Alcohol dependence with alcohol-induced mood disorder (HCC) Active Problems:   Severe recurrent major depression with psychotic features ([place])  Total Time spent with patient: 15 minutes  Past Psychiatric History: See admission H&P  Past Medical History:  Past Medical History:  Diagnosis Date  . Lupus ([place])     History reviewed. No pertinent surgical history. Family History: History reviewed. No pertinent family history. Family Psychiatric  History: See admission H&P Social History:  Social History   Substance and Sexual Activity  Alcohol Use None     Social History   Substance and Sexual Activity  Drug Use Not on file    Social History   Socioeconomic History  . Marital status: Single    Spouse name: Not on file  . Number of children: Not on file  . Years of education: Not on file  . Highest education level: Not on file  Occupational History  . Not on file  Social Needs  . Financial resource strain: Not on file  . Food insecurity    Worry: Not on file    Inability: Not on file  . Transportation needs    Medical: Not on file    Non-medical: Not on file  Tobacco Use  . Smoking status: Not on file  Substance and Sexual Activity  . Alcohol use: Not on file  . Drug use: Not on file  . Sexual activity: Not on file  Lifestyle  . Physical activity    Days per week: Not on file    Minutes per session: Not on file  . Stress: Not on file  Relationships  . Social Herbalist on phone: Not on file    Gets together: Not on file    Attends religious service: Not on file    Active member of club or organization: Not on file    Attends meetings of clubs or organizations: Not on file    Relationship status: Not on file  Other Topics Concern  . Not on file  Social  History Narrative  . Not on file   Additional Social History:   Sleep: Fair/ improving   Appetite: Improving  Current Medications: Current Facility-Administered Medications  Medication Dose Route Frequency Provider Last Rate Last Dose  . acamprosate (CAMPRAL) tablet 666 mg  666 mg Oral TID Cobos, Myer Peer, MD   666 mg at 02/02/19 1147  . alum & mag hydroxide-simeth (MAALOX/MYLANTA) 200-200-20 MG/5ML suspension 30 mL  30 mL Oral Q4H PRN Lindon Romp A, NP   30 mL at 01/28/19 2004  . docusate sodium  (COLACE) capsule 200 mg  200 mg Oral Daily Cobos, Myer Peer, MD   200 mg at 01/28/19 0747  . feeding supplement (ENSURE ENLIVE) (ENSURE ENLIVE) liquid 237 mL  237 mL Oral BID BM Cobos, Myer Peer, MD   237 mL at 02/02/19 1254  . gabapentin (NEURONTIN) tablet 800 mg  800 mg Oral TID Cobos, Myer Peer, MD   800 mg at 02/02/19 1147  . hydrOXYzine (ATARAX/VISTARIL) tablet 25 mg  25 mg Oral TID PRN Cobos, Myer Peer, MD   25 mg at 02/02/19 1524  . magnesium hydroxide (MILK OF MAGNESIA) suspension 30 mL  30 mL Oral Daily PRN Lindon Romp A, NP   30 mL at 01/29/19 2014  . multivitamin with minerals tablet 1 tablet  1 tablet Oral Daily Lindon Romp A, NP   1 tablet at 02/02/19 0830  . naltrexone (DEPADE) tablet 25 mg  25 mg Oral Daily Cobos, Myer Peer, MD   25 mg at 02/02/19 0829  . nicotine polacrilex (NICORETTE) gum 2 mg  2 mg Oral PRN Cobos, Myer Peer, MD   2 mg at 02/02/19 1524  . potassium chloride (KLOR-CON) CR tablet 10 mEq  10 mEq Oral BID Cobos, Myer Peer, MD   10 mEq at 02/02/19 0829  . QUEtiapine (SEROQUEL) tablet 25 mg  25 mg Oral BH-q7a Cobos, Myer Peer, MD   25 mg at 02/02/19 0609  . QUEtiapine (SEROQUEL) tablet 400 mg  400 mg Oral QHS Cobos, Myer Peer, MD   400 mg at 02/01/19 2147  . thiamine (VITAMIN B-1) tablet 100 mg  100 mg Oral Daily Lindon Romp A, NP   100 mg at 02/02/19 0829  . venlafaxine XR (EFFEXOR-XR) 24 hr capsule 150 mg  150 mg Oral Q breakfast Cobos, Myer Peer, MD   150 mg at 02/02/19 5701    Lab Results:  Results for orders placed or performed during the hospital encounter of 01/20/19 (from the past 48 hour(s))  Basic metabolic panel     Status: Abnormal   Collection Time: 02/01/19  6:14 AM  Result Value Ref Range   Sodium 138 135 - 145 mmol/L   Potassium 3.3 (L) 3.5 - 5.1 mmol/L   Chloride 92 (L) 98 - 111 mmol/L   CO2 35 (H) 22 - 32 mmol/L   Glucose, Bld 96 70 - 99 mg/dL   BUN 10 6 - 20 mg/dL   Creatinine, Ser 0.85 0.44 - 1.00 mg/dL   Calcium 9.9 8.9 - 10.3  mg/dL   GFR calc non Af Amer >60 >60 mL/min   GFR calc Af Amer >60 >60 mL/min   Anion gap 11 5 - 15    Comment: Performed at [facility], [place] [address] [place], [place] [address]    Blood Alcohol level:  Lab Results  Component Value Date   QZE 092 [facility]) 01/19/2019   ETH 474 (HH) 33/00/7622    Metabolic Disorder Labs:  Lab Results  Component Value Date   HGBA1C 4.9 01/21/2019   MPG 93.93 01/21/2019   No results found for: PROLACTIN Lab Results  Component Value Date   CHOL 189 01/21/2019   TRIG 82 01/21/2019   HDL 124 01/21/2019   CHOLHDL 1.5 01/21/2019   VLDL 16 01/21/2019   LDLCALC 49 01/21/2019    Physical Findings: AIMS: Facial and Oral Movements Muscles of Facial Expression: None, normal Lips and Perioral Area: None, normal Jaw: None, normal Tongue: None, normal,Extremity Movements Upper (arms, wrists, hands, fingers): None, normal Lower (legs, knees, ankles, toes): None, normal, Trunk Movements Neck, shoulders, hips: None, normal, Overall Severity Severity of abnormal movements (highest score from questions above): None, normal Incapacitation due to abnormal movements: None, normal Patient's awareness of abnormal movements (rate only patient's report): No Awareness, Dental Status Current problems with teeth and/or dentures?: No Does patient usually wear dentures?: No  CIWA:  CIWA-Ar Total: 1 COWS:  COWS Total Score: 2  Musculoskeletal: Strength & Muscle Tone: within normal limits Gait & Station: normal Patient leans: N/A  Psychiatric Specialty Exam: Physical Exam  Nursing note and vitals reviewed. Constitutional: She is oriented to person, place, and time. She appears well-developed and well-nourished.  HENT:  Head: Normocephalic and atraumatic.  Respiratory: Effort normal.  Neurological: She is alert and oriented to person, place, and time.    ROS no chest pain, no shortness of breath, no vomiting.Denies dizziness or  lightheadedness   Blood pressure 95/71, pulse 95, temperature 98.1 F (36.7 C), temperature source Oral, resp. rate 19, height '5\' 5"'$  (1.651 m), weight 62.1 kg, SpO2 98 %.Body mass index is 22.8 kg/m.  General Appearance: Casual  Eye Contact:  Good  Speech:  Normal Rate  Volume:  Normal  Mood:  gradual/partial improvement   Affect:  more reactive, less anxious, smiles at times appropriately during session  Thought Process:  Linear and Descriptions of Associations: Intact  Orientation:  Full (Time, Place, and Person)  Thought Content:  no hallucinations, no delusions, not internally preoccupied  Suicidal Thoughts: Denies current suicidal or self-injurious ideations and contracts for safety on unit, no homicidal or violent ideations  Homicidal Thoughts:  No  Memory:  Recent and remote grossly intact  Judgement:  improving  Insight:  Fair/improving  Psychomotor Activity:  Normal-no current psychomotor restlessness  Concentration:  Concentration: Good and Attention Span: Good  Recall:  Good  Fund of Knowledge:  Good  Language:  Good  Akathisia:  Negative  Handed:  Right  AIMS (if indicated):     Assets:  Desire for Improvement Housing Resilience  ADL's:  Intact  Cognition:  WNL  Sleep:  Number of Hours: 5.75   Assessment:  33 year old single female, lives alone, presented voluntarily on 11/15 reporting SI, triggered by an argument with her mother.  Reported chronic depression which had been worsening recently, neurovegetative symptoms, heavy/regular drinking.  Admission BAL was 442.  Endorsed past history of polysubstance abuse (cocaine, opiates, NMDA) but not in several years.  Patient presents with partially/gradually improving mood and range of affect. Today denies SI and presents future oriented, focusing on disposition planning . She denies medication side effects,and  has tolerated Naltrexone ( started for help with alcohol use disorder/abstinence efforts ) well thus far . She  remains anxious and fearful of being discharged back home/into community as states she would likely relapse on ETOH and expresses fear that this in turn would cause decompensation of mood and physical health. Her mother has corroborated this  concern and has been working with CSW in finding a rehab setting .  Patient's BP has improved and is no longer hypotensive. No dizziness or lightheadedness . Slept better last night and does not endorse nightmares ( minipress was stopped due to hypotension/orthostasis)       Treatment Plan Summary: Treatment plan reviewed as below today 11/29 Encourage group and milieu participation Encourage efforts to work on sobriety Continue Neurontin  800 mg p.o. 3 times daily for anxiety. Continue Hydroxyzine 50 mg p.o. 3 times daily as needed anxiety. Continue Effexor XR 150 mgrs QHS for depression, anxiety Continue Nicotine patch to 14 mg transdermally over 24 hours for nicotine dependence. Continue Seroquel  25  mgrs bid and  400  mg nightly for mood stability and insomnia. Increase  Naltrexone to 50 mgrs QDAY for alcohol dependence Continue  Campral  666 mg p.o.three times daily for alcohol dependence ( CrCl 78.9) Continue to encourage PO fluids. Monitor vitals Q 8 hours  Discharge plan in progress . Team working on referral to Rehab setting.     Jenne Campus, MD 02/02/2019, 3:39 PM   Patient ID: Alison Koch, female   DOB: 02/09/1986, 33 y.o.   MRN: [id number]

## 2019-02-02 NOTE — Progress Notes (Signed)
[place] NOVEL CORONAVIRUS (COVID-19) DAILY CHECK-OFF SYMPTOMS - answer yes or no to each - every day NO YES  Have you had a fever in the past 24 hours?  . Fever (Temp > 37.80C / 100F) X   Have you had any of these symptoms in the past 24 hours? . New Cough .  Sore Throat  .  Shortness of Breath .  Difficulty Breathing .  Unexplained Body Aches   X   Have you had any one of these symptoms in the past 24 hours not related to allergies?   . Runny Nose .  Nasal Congestion .  Sneezing   X   If you have had runny nose, nasal congestion, sneezing in the past 24 hours, has it worsened?  X   EXPOSURES - check yes or no X   Have you traveled outside the state in the past 14 days?  X   Have you been in contact with someone with a confirmed diagnosis of COVID-19 or PUI in the past 14 days without wearing appropriate PPE?  X   Have you been living in the same home as a person with confirmed diagnosis of COVID-19 or a PUI (household contact)?    X   Have you been diagnosed with COVID-19?    X              What to do next: Answered NO to all: Answered YES to anything:   Proceed with unit schedule Follow the [facility] Inpatient Flowsheet.   

## 2019-02-02 NOTE — Progress Notes (Signed)
D.  Pt pleasant on approach, no complaints voiced at this time.  Pt states that she feels like her medications are "finally right".  She was positive for evening wrap up group, observed giving appropriate support to new roommate.  Pt denies SI/HI/AVH at this time.  A.  Support and encouragement offered, medications give as ordered  R.  Pt remains safe on the unit, will continue to monitor.

## 2019-02-03 ENCOUNTER — Ambulatory Visit (HOSPITAL_COMMUNITY): Payer: BLUE CROSS/BLUE SHIELD

## 2019-02-03 MED ORDER — ACAMPROSATE CALCIUM 333 MG PO TBEC: 666.0000 mg | DELAYED_RELEASE_TABLET | Freq: Three times a day (TID) | ORAL | 0 refills | Status: DC

## 2019-02-03 MED ORDER — VITAMIN B-1 100 MG PO TABS: 100.0000 mg | ORAL_TABLET | Freq: Every day | ORAL | 0 refills | Status: DC

## 2019-02-03 MED ORDER — QUETIAPINE FUMARATE 400 MG PO TABS: 400.0000 mg | ORAL_TABLET | Freq: Every day | ORAL | 0 refills | Status: DC

## 2019-02-03 MED ORDER — GABAPENTIN 800 MG PO TABS: 800.0000 mg | ORAL_TABLET | Freq: Three times a day (TID) | ORAL | 0 refills | Status: DC

## 2019-02-03 MED ORDER — NICOTINE POLACRILEX 2 MG MT GUM: 2.0000 mg | CHEWING_GUM | OROMUCOSAL | 0 refills | Status: DC | PRN

## 2019-02-03 MED ORDER — VENLAFAXINE HCL ER 150 MG PO CP24: 150.0000 mg | ORAL_CAPSULE | Freq: Every day | ORAL | 0 refills | Status: DC

## 2019-02-03 MED ORDER — QUETIAPINE FUMARATE 25 MG PO TABS: 25.0000 mg | ORAL_TABLET | ORAL | 0 refills | Status: DC

## 2019-02-03 MED ORDER — NALTREXONE HCL 50 MG PO TABS: 50.0000 mg | ORAL_TABLET | Freq: Every day | ORAL | 0 refills | Status: DC

## 2019-02-03 MED ORDER — ENSURE ENLIVE PO LIQD: 237.0000 mL | Freq: Two times a day (BID) | ORAL | 0 refills | Status: DC

## 2019-02-03 MED ORDER — HYDROXYZINE HCL 25 MG PO TABS: 25.0000 mg | ORAL_TABLET | Freq: Three times a day (TID) | ORAL | 0 refills | Status: DC | PRN

## 2019-02-03 NOTE — Progress Notes (Addendum)
Patient was scheduled to discharge home with partial hospitalization services today, 02/03/2019.   CSW received a message from Dr. Parke Poisson, MD stating that the patient's mother confirmed the patient had a bed at [facility] today.   CSW contacted [facility] and spoke with admissions coordinator, Leafy Ro [phone]) regarding an update on the patient's referral.   As of 02/03/2019 at 1:22pm, the patient continues to NOT have a bed at this time due to her insurance being out of network. According to [facility] (admissions), the patient's mother has attempted to have the patient's insurance provider "authorize" her treatment bed, however that has not occurred at this time. Leafy Ro shared that the patient does not have a bed at this time.   CSW, Stephanie Acre, [place] also contacted [facility] to confirm the information stated above.   The patient was scheduled to begin [facility] Program today, 02/03/19 at 12:00pm, however the patient did not discharge in time to begin the program. This was RESCHEDULED from her original [place] appointment on 01/27/19, due to similar circumstances on discharge day.   Patient and the patient's mother have been informed on multiple occassions on placement barriers and the best alternative follow up discharge plans due to no treatment beds being available at this time.  ____________________________________________________________________________  [facility] CSW department (Karri Kallenbach [place], [place]) have worked diligently in referring the patient to appropriate facilities for potential treatment, however the patient's insurance provider is "Out of Network" for majority facilities contacted. Patient only wanted to be referred to residential programs in [place].  Facilities contacted over the last two weeks:  [facility] ([place], [place]) - insurance out of [place] ([place], [place]) - declined due to behavioral health issues [facility] ([place], [place]) - insurance of out network [facility] - no bed availability until January 2021.   Treatment team has discussed concerns of the patient sabotaging discharge plans once a discharge date has been set. There has been similar occurrences each time the patient and her mother are informed of a discharge date.    Dr. Neita Garnet, MD notified.    Radonna Ricker, MSW, LCSW Clinical Social Worker [facility]  Phone: [phone]

## 2019-02-03 NOTE — Progress Notes (Signed)
D:  Patient's self inventory sheet, patient sleeps good, sleep medication helpful.  Poor appetite, normal energy level, poor concentration.  Rated depression and hopeless 9, anxiety 10.  Withdrawals, cravings.  Denied SI.  Denied physical problems.  Denied physical pain.  Goal is treatment program.  Plans to talk with treatment team. Only been outside once in 2 weeks.  Does have discharge plans. A:  Medications administered per MD orders.  Emotional support and encouragement given patient. R:  Denied SI and HI, contracts for safety.  Denied A/V hallucinations.  Safety maintained with 15 minute checks.

## 2019-02-03 NOTE — Progress Notes (Signed)
Adult Psychoeducational Group Note  Date:  02/03/2019 Time:  9:17 AM  Group Topic/Focus:  Wrap-Up Group:   The focus of this group is to help patients review their daily goal of treatment and discuss progress on daily workbooks.  Participation Level:  Active  Participation Quality:  Appropriate  Affect:  Appropriate  Cognitive:  Appropriate  Insight: Good  Engagement in Group:  Engaged  Modes of Intervention:  Rapport Building  Additional Comments:  Pt said her day was a 3. Her goal for today was to read and rest and she was able to accomplish her goal. Her coping skills was reading and no other questions. Lenice Llamas Long 02/03/2019, 9:17 AM

## 2019-02-03 NOTE — Progress Notes (Signed)
D.  Pt pleasant on approach, no complaints voiced.  Pt was positive for evening wrap up group, observed interacting appropriately with peers on the unit.  Pt appears brighter, more outgoing.  Pt is hopeful to begin long term treatment soon.  Pt is compliant with request to drink more fluids to keep BP up, has requested pitcher of Gatorade each night.  Pt denies SI/HI/AVH at this time.  A.  Support and encouragement offered, medication given as ordered.

## 2019-02-03 NOTE — Plan of Care (Signed)
Patient seen regarding fitness for discharge she has been hospitalized since the 16th she has completed her detox regimen.  She had hoped to go directly to [facility] treatment center however states because it is out of network she is awaiting insurance approval  Her current mental status exam is as follows-alert oriented fully and cooperative, affect appropriate denies feeling physically depressed denies manic symptoms denies thoughts of harming self or others understands what it is to contract for safety.  No psychosis no cravings tremors or withdrawal  Recommendations Clear for discharge no acute dangerousness or need to hold her against her will

## 2019-02-03 NOTE — Progress Notes (Addendum)
  [facility] Adult Case Management Discharge Plan :  Will you be returning to the same living situation after discharge:  Yes,  patient is returning home  At discharge, do you have transportation home?: Yes,  patient's mother is picking her up  Do you have the ability to pay for your medications: Yes,  BCBS  Release of information consent forms completed and in the chart;  Patient's signature needed at discharge.  Patient to Follow up at: Follow-up Information    BEHAVIORAL HEALTH PARTIAL HOSPITALIZATION PROGRAM. Call on 02/03/2019.   Specialty: Behavioral Health Why: You will receive a telephone call this afternoon to reschedule your initial [facility] WebEx appointment. Please be sure to check your email for the required link at discharge.  If you have any questions or concerns, please contact [phone] option 3. Contact information: [place] [id number] [place] [phone]          Next level of care provider has access to [place] and Suicide Prevention discussed: Yes,  with the patient's mother     Has patient been referred to the Quitline?: N/A patient is not a smoker  Patient has been referred for addiction treatment: Yes  Marylee Floras, [place] 02/03/2019, 2:46 PM

## 2019-02-03 NOTE — Progress Notes (Signed)
Recreation Therapy Notes  Date:  11.30.20 Time: 0930 Location: 300 Hall Dayroom  Group Topic: Stress Management  Goal Area(s) Addresses:  Patient will identify positive stress management techniques. Patient will identify benefits of using stress management post d/c.  Behavioral Response:  Engaged  Intervention: Stress Management  Activity : Meditation.  LRT played Koch meditation that focused on making choices.  Patients were to listen and follow along as meditation played to engage in activity.  Education:  Stress Management, Discharge Planning.   Education Outcome: Acknowledges Education  Clinical Observations/Feedback:  Pt attended and participated in the activity.    Victorino Sparrow, LRT/CTRS         Alison Koch, Alison Koch 02/03/2019 11:12 AM

## 2019-02-03 NOTE — Progress Notes (Signed)
[place] NOVEL CORONAVIRUS (COVID-19) DAILY CHECK-OFF SYMPTOMS - answer yes or no to each - every day NO YES  Have you had a fever in the past 24 hours?  . Fever (Temp > 37.80C / 100F) X   Have you had any of these symptoms in the past 24 hours? . New Cough .  Sore Throat  .  Shortness of Breath .  Difficulty Breathing .  Unexplained Body Aches   X   Have you had any one of these symptoms in the past 24 hours not related to allergies?   . Runny Nose .  Nasal Congestion .  Sneezing   X   If you have had runny nose, nasal congestion, sneezing in the past 24 hours, has it worsened?  X   EXPOSURES - check yes or no X   Have you traveled outside the state in the past 14 days?  X   Have you been in contact with someone with a confirmed diagnosis of COVID-19 or PUI in the past 14 days without wearing appropriate PPE?  X   Have you been living in the same home as a person with confirmed diagnosis of COVID-19 or a PUI (household contact)?    X   Have you been diagnosed with COVID-19?    X              What to do next: Answered NO to all: Answered YES to anything:   Proceed with unit schedule Follow the [facility] Inpatient Flowsheet.   

## 2019-02-03 NOTE — BHH Counselor (Signed)
CSW spoke with "Leafy Ro," intake clinician at [facility] [phone]) to confirm that this patient has not been accepted to [facility] and does not have a bed assignment for this date.  Leafy Ro confirmed information shared this afternoon with CSW, Radonna Ricker. Leafy Ro also shared that she has spoken with the patient's mother, Adia Crammer, several times today and that patient's mother is aware that this patient has not been accepted to [facility] due to patient's insurance being out of network and a payor source not being identified. Leafy Ro tells CSW that this was explained to patient's mother and that [place] would need to initiate contact with [facility]. Until a payor source is finalized Psychologist, prison and probation services [facility] with authorization, or mother private paying upfront), the patient cannot be accepted for residential treatment.  [place] CSWs have been in communication with patient, patient's mother, psychiatry, and treatment facilities where patient was referred throughout her hospitalization. Please review chart for detailed communication log.   At this time, this patient has missed two [facility] assessments. This patient was denied by [facility] for SI and behavorial issues. This patient has not been offered a bed a [facility] due to similar insurance barriers to [facility]. Patient declined residential treatment referrals during the first 7 days of her hospitalization, patient agreed to residential treatment after discharge orders were written on 11/23. Patient was referred to the three identified facilities as she initially did not want to pursue treatment outside of [facility], patient and mother later agreed to referrals for [facility] and [facility].  CSW signing off.  Stephanie Acre, MSW, [place] Social Worker [facility]  [phone]

## 2019-02-03 NOTE — Progress Notes (Signed)
Spiritual care group on grief and loss facilitated by chaplain Jerene Pitch MDiv, BCC  Group Goal:  Support / Education around grief and loss Members engage in facilitated group support and psycho-social education.  Group Description:  Following introductions and group rules, group members engaged in facilitated group dialog and support around topic of loss, with particular support around experiences of loss in their lives. Group Identified types of loss (relationships / self / things) and identified patterns, circumstances, and changes that precipitate losses. Reflected on thoughts / feelings around loss, normalized grief responses, and recognized variety in grief experience.   Group noted Worden's four tasks of grief in discussion.  Group drew on Adlerian / Rogerian, narrative, MI, Patient Progress:  Present during group.  Appropriate affect.  Reflected on her time at [facility] - noting that it has been bookended by this group.  Noted that she is finding ways to not carry "Blame or guilt" for what she experienced.  Described finding space to care for herself going forward - most notably in attending treatment center in [place].

## 2019-02-03 NOTE — Progress Notes (Signed)
CSW attempted to reach mother, Stephenia Vogan [phone]) to confirm discharge plans for this morning. The call was not answered, CSW left a HIPAA compliant VM with callback information.  Stephanie Acre, MSW, [place] Social Worker [facility]  [phone]

## 2019-02-03 NOTE — Progress Notes (Signed)
Discharge note,  Pt discharged to home, her mother picked her up and provided  transportation. Pt denies SI/HI. All inventoried items returned with signature.

## 2019-02-03 NOTE — Discharge Summary (Addendum)
Physician Discharge Summary Note  Alison Koch:  Alison Koch is an 33 y.o., female MRN:  [id number] DOB:  1985/10/13 Alison Koch phone:  [phone] (home)  Alison Koch address:   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 History of alcohol use disorder for years ongoing. She was seen in [facility] in July of this year for a fall with BAL 474 at that time. She reports history of bipolar disorder diagnosis but states this diagnosis was made at a substance use disorder center while detoxing, and she does not believe it was accurate. No clear history of mania or psychosis. Two prior suicide attempts in 2010.  Past Medical History:  Past Medical History:  Diagnosis Date  . Lupus (HCC)    History reviewed. No pertinent surgical history. Family History: History reviewed. No pertinent family history. Family Psychiatric  History: Biological father had cocaine use disorder. Social History:  Social History   Substance and Sexual Activity  Alcohol Use None     Social History   Substance and Sexual Activity  Drug Use Not on file    Social History   Socioeconomic History  . Marital status: Single    Spouse name: Not on file  . Number of children: Not on file  . Years of education: Not on file  . Highest education level: Not on file  Occupational History  . Not on file  Social Needs  . Financial resource strain: Not on file  . Food insecurity    Worry: Not on file     Inability: Not on file  . Transportation needs    Medical: Not on file    Non-medical: Not on file  Tobacco Use  . Smoking status: Not on file  Substance and Sexual Activity  . Alcohol use: Not on file  . Drug use: Not on file  . Sexual activity: Not on file  Lifestyle  . Physical activity    Days per week: Not on file    Minutes per session: Not on file  . Stress: Not on file  Relationships  . Social Musician on phone: Not on file    Gets together: Not on file    Attends religious service: Not on file    Active member of club or organization: Not on file    Attends meetings of clubs or organizations: Not on file    Relationship status: Not on file  Other Topics Concern  . Not on file  Social History Narrative  . Not on file    Hospital Course:  From admission H&P: Alison Koch is a 33 year old female with history of SLE in remission, alcohol use disorder (active), and cocaine and heroin use disorder in remission, presenting for treatment of suicidal ideation. She moved from [place] to [place] in February of this year to be closer to her mother, and she reports difficulties since the move. She had left behind a job in [place] but has not been able to secure employment in this area related to the pandemic. Her  mother's live-in boyfriend does not get along with the Alison Koch and will not allow her in their home. She reports alcohol dependence for years, but her drinking has increased since the move, now drinking 5-6 high-alcohol content beers each day. She was triggered by an argument with her mother on the day of admission. She reports feeling that her mother does not give her sufficient independence. Her mother told her that she was going to lose her job related to accompanying the Alison Koch to different appointments for her mental health, and the Alison Koch reports she became upset because she had never asked her mother to accompany her to these appointments. She became acutely  suicidal and drank a bottle of vodka. Admission BAL 442. She admits to drinking beers daily but denies regular liquor consumption. She reports almost daily marijuana usage. She has remote history of cocaine, ecstasy, and heroin use but states she has been sober from all these substances since she went to rehab in 2010. UDS positive for THC only. She reports starting gabapentin 400 mg TID and Seroquel 150 mg QHS three months ago. She was started on Effexor two weeks ago. She denies current SI. Denies HI/AVH. She reports nausea with one episode of vomiting this morning. Mild tremor observed. She reports a history of seizures while withdrawing from alcohol.  Alison Koch was admitted for alcohol dependence with suicidal ideation. She remained on the [facility] unit for 14 days. Ativan CIWA protocol was started for alcohol withdrawal. Effexor, Seroquel and Neurontin were all continued and gradually increased over the course of hospitalization. Campral was started for alcohol cravings. PRN Vistaril was increased for anxiety. Trazodone was started for insomnia, and Minipress was started for nightmares but both later discontinued due to low blood pressures. She participated in group therapy on the unit.  Alison Koch initially declined residential substance use treatment for after discharge, and admission to [facility] was scheduled for 01/27/19. She was originally scheduled for discharge on 01/27/19 but did not discharge at that time due to Alison Koch's complaints of continued cravings for alcohol and threats of self-harm, and mother's repeated calls with concerns that Alison Koch was not ready for discharge at that time, with threats to sue the hospital. Alison Koch remained on the unit for an additional 7 days. Alison Koch decided at that point that she would like residential rehab at discharge. CSWs spent extensive time making referrals for follow-up (see CSW notes) and collaborating on the phone with Alison Koch's mother, who was very involved in  Alison Koch's care. Alison Koch was declined at [facility] due to insurance, [facility] due to behavioral health, [facility] due to no bed availability. [employer] is reviewing with her insurance. Naltrexone was started in addition to Campral for alcohol cravings.   Alison Koch has shown improvement over the course of hospitalization, with improved mood, affect, sleep, and interaction. On day of discharge, she presents with euthymic affect and reports stable mood. Her mother has called repeatedly expressing concern that the Alison Koch should not be discharged because mother believes she will relapse on alcohol. Alison denies alcohol cravings and expresses motivation to continue treatment through [facility] as well as through residential rehab pending insurance approval. No signs or symptoms of withdrawal. She vehemently denies any SI/HI or any thoughts of harming herself or others and contracts for safety. She denies AVH and shows no signs of responding to internal stimuli. No delusional or paranoid thought content expressed. Alison Koch's second admission appointment for PHP was missed today due to Alison Koch's mother's repeated phone calls delaying discharge.  Alison Koch's PHP admission appointment is being rescheduled (see below). CSW and MD have communicated with [employer] and [employer] regarding insurance coverage there, which is still pending at this time. The Alison Koch is requesting discharge. Alison Koch is frustrated with mother's attempts to prevent discharge but otherwise showing stable mood today. She is discharging on the medications listed below. She is provided with prescriptions for medications upon discharge. Her mother is picking her up for discharge home.  Physical Findings: AIMS: Facial and Oral Movements Muscles of Facial Expression: None, normal Lips and Perioral Area: None, normal Jaw: None, normal Tongue: None, normal,Extremity Movements Upper (arms, wrists, hands, fingers): None,  normal Lower (legs, knees, ankles, toes): None, normal, Trunk Movements Neck, shoulders, hips: None, normal, Overall Severity Severity of abnormal movements (highest score from questions above): None, normal Incapacitation due to abnormal movements: None, normal Alison Koch's awareness of abnormal movements (rate only Alison Koch's report): No Awareness, Dental Status Current problems with teeth and/or dentures?: No Does Alison Koch usually wear dentures?: No  CIWA:  CIWA-Ar Total: 1 COWS:  COWS Total Score: 2  Musculoskeletal: Strength & Muscle Tone: within normal limits Gait & Station: normal Alison Koch leans: N/A  Psychiatric Specialty Exam: Physical Exam  Nursing note and vitals reviewed. Constitutional: She is oriented to person, place, and time. She appears well-developed and well-nourished.  Cardiovascular: Normal rate.  Respiratory: Effort normal.  Neurological: She is alert and oriented to person, place, and time.    Review of Systems  Constitutional: Negative.   Respiratory: Negative for cough and shortness of breath.   Cardiovascular: Negative for chest pain.  Gastrointestinal: Negative for abdominal pain, diarrhea, nausea and vomiting.  Neurological: Negative for tremors, sensory change and headaches.  Psychiatric/Behavioral: Positive for depression (stable on medication) and substance abuse (ETOH). Negative for hallucinations, memory loss and suicidal ideas. The Alison Koch is not nervous/anxious and does not have insomnia.     Blood pressure 91/70, pulse 86, temperature 99.6 F (37.6 C), temperature source Oral, resp. rate 19, height  (1.651 m), weight 62.1 kg, SpO2 98 %.Body mass index is 22.8 kg/m.  General Appearance: Casual  Eye Contact:  Good  Speech:  Normal Rate  Volume:  Normal  Mood:  Euthymic  Affect:  Appropriate and Congruent  Thought Process:  Coherent and Goal Directed  Orientation:  Full (Time, Place, and Person)  Thought Content:  Logical  Suicidal  Thoughts:  No  Homicidal Thoughts:  No  Memory:  Immediate;   Good Recent;   Good Remote;   Good  Judgement:  Intact  Insight:  Fair  Psychomotor Activity:  Normal  Concentration:  Concentration: Good and Attention Span: Good  Recall:  Good  Fund of Knowledge:  Fair  Language:  Good  Akathisia:  No  Handed:  Right  AIMS (if indicated):     Assets:  Communication Skills Desire for Improvement Financial Resources/Insurance Housing Leisure Time Resilience Social Support  ADL's:  Intact  Cognition:  WNL  Sleep:  Number of Hours: 6.75        Has this Alison Koch used any form of tobacco in the last 30 days? (Cigarettes, Smokeless Tobacco, Cigars, and/or Pipes) Yes, a prescription for an FDA-approved medication for tobacco cessation was offered at discharge.   Blood Alcohol level:  Lab Results  Component Value Date   ETH 442 [facility]) 01/19/2019   ETH 474 ([facility]) 09/12/2018    Metabolic Disorder Labs:  Lab Results  Component Value Date   HGBA1C 4.9 01/21/2019   MPG 93.93  01/21/2019   No results found for: PROLACTIN Lab Results  Component Value Date   CHOL 189 01/21/2019   TRIG 82 01/21/2019   HDL 124 01/21/2019   CHOLHDL 1.5 01/21/2019   VLDL 16 01/21/2019   LDLCALC 49 01/21/2019    See Psychiatric Specialty Exam and Suicide Risk Assessment completed by Attending Physician prior to discharge.  Discharge destination:  Home  Is Alison Koch on multiple antipsychotic therapies at discharge:  No   Has Alison Koch had three or more failed trials of antipsychotic monotherapy by history:  No  Recommended Plan for Multiple Antipsychotic Therapies: NA  Discharge Instructions    Discharge instructions   Complete by: As directed    Alison Koch is instructed to take all prescribed medications as recommended. Report any side effects or adverse reactions to your outpatient psychiatrist. Alison Koch is instructed to abstain from alcohol and illegal drugs while on prescription medications. In the  event of worsening symptoms, Alison Koch is instructed to call the crisis hotline, 911, or go to the nearest emergency department for evaluation and treatment.     Allergies as of 02/03/2019      Reactions   Codeine Other (See Comments)   Dizziness, syncope   Buspar [buspirone]    Vancomycin       Medication List    STOP taking these medications   gabapentin 400 MG capsule Commonly known as: NEURONTIN Replaced by: gabapentin 800 MG tablet   ibuprofen 800 MG tablet Commonly known as: ADVIL   meclizine 25 MG tablet Commonly known as: ANTIVERT   Melatonin Maximum Strength 5 MG Tabs Generic drug: Melatonin   progesterone 200 MG capsule Commonly known as: PROMETRIUM   traZODone 50 MG tablet Commonly known as: DESYREL     TAKE these medications     Indication  acamprosate 333 MG tablet Commonly known as: CAMPRAL Take 2 tablets (666 mg total) by mouth 3 (three) times daily.  Indication: Abuse or Misuse of Alcohol   albuterol 108 (90 Base) MCG/ACT inhaler Commonly known as: VENTOLIN HFA Inhale 2 puffs into the lungs every 6 (six) hours as needed for wheezing or shortness of breath.  Indication: Shortness of breath   docusate sodium 100 MG capsule Commonly known as: COLACE Take 2 capsules (200 mg total) by mouth daily. (may buy from over the counter): for constipation  Indication: Constipation   feeding supplement (ENSURE ENLIVE) Liqd Take 237 mLs by mouth 2 (two) times daily between meals.  Indication: Supplementation   gabapentin 800 MG tablet Commonly known as: NEURONTIN Take 1 tablet (800 mg total) by mouth 3 (three) times daily. For agitation/substance withdrawal syndrome Replaces: gabapentin 400 MG capsule  Indication: Alcohol Withdrawal Syndrome, Agitation   hydrOXYzine 25 MG tablet Commonly known as: ATARAX/VISTARIL Take 1 tablet (25 mg total) by mouth 3 (three) times daily as needed for anxiety. What changed:   medication strength  reasons to take  this  Indication: Feeling Anxious   Multi-Vitamin tablet Take 1 tablet by mouth daily.  Indication: Vitamin supplement   naltrexone 50 MG tablet Commonly known as: DEPADE Take 1 tablet (50 mg total) by mouth daily. Start taking on: February 04, 2019  Indication: Abuse or Misuse of Alcohol   nicotine polacrilex 2 MG gum Commonly known as: NICORETTE Take 1 each (2 mg total) by mouth as needed for smoking cessation.  Indication: Nicotine Addiction   QUEtiapine 400 MG tablet Commonly known as: SEROQUEL Take 1 tablet (400 mg total) by mouth at bedtime. What changed: You  were already taking a medication with the same name, and this prescription was added. Make sure you understand how and when to take each.  Indication: Major Depressive Disorder   QUEtiapine 25 MG tablet Commonly known as: SEROQUEL Take 1 tablet (25 mg total) by mouth every morning. Start taking on: February 04, 2019 What changed:   medication strength  how much to take  when to take this  reasons to take this  Indication: Major Depressive Disorder   thiamine 100 MG tablet Commonly known as: VITAMIN B-1 Take 1 tablet (100 mg total) by mouth daily. For low thiamine What changed: additional instructions  Indication: Deficiency of Vitamin B1   venlafaxine XR 150 MG 24 hr capsule Commonly known as: EFFEXOR-XR Take 1 capsule (150 mg total) by mouth daily with breakfast. Start taking on: February 04, 2019 What changed:   medication strength  how much to take  when to take this  Indication: Major Depressive Disorder      Follow-up Information    BEHAVIORAL HEALTH PARTIAL HOSPITALIZATION PROGRAM. Call on 02/03/2019.   Specialty: Behavioral Health Why: You will receive a telephone call this afternoon to reschedule your initial [facility] WebEx appointment. Please be sure to check your email for the required link at discharge.  If you have any questions or concerns, please contact [phone] option 3. Contact  information: [address] [id number] [place] [id number] [phone]          Follow-up recommendations: Activity as tolerated. Diet as recommended by primary care physician. Keep all scheduled follow-up appointments as recommended.   Comments:   Alison Koch is instructed to take all prescribed medications as recommended. Report any side effects or adverse reactions to your outpatient psychiatrist. Alison Koch is instructed to abstain from alcohol and illegal drugs while on prescription medications. In the event of worsening symptoms, Alison Koch is instructed to call the crisis hotline, 911, or go to the nearest emergency department for evaluation and treatment.  Signed: Aldean BakerJanet E Sykes, NP 02/03/2019, 3:08 PM   Alison Koch seen, Suicide Assessment Completed.  Disposition Plan Reviewed

## 2019-02-03 NOTE — BHH Suicide Risk Assessment (Signed)
[facility] Discharge Suicide Risk Assessment   Principal Problem: Alcohol dependence with alcohol-induced mood disorder [facility]) Discharge Diagnoses: Principal Problem:   Alcohol dependence with alcohol-induced mood disorder ([place]) Active Problems:   Severe recurrent major depression with psychotic features ([place])   Total Time spent with patient: 30 minutes  Musculoskeletal: Strength & Muscle Tone: within normal limits Gait & Station: normal Patient leans: N/A  Psychiatric Specialty Exam: ROS denies headache, no visual disturbances, no chest pain, no cough, no shortness of breath, no vomiting, no rash  Blood pressure 91/70, pulse 86, temperature 99.6 F (37.6 C), temperature source Oral, resp. rate 19, height 5\' 5"  (1.651 m), weight 62.1 kg, SpO2 98 %.Body mass index is 22.8 kg/m.  General Appearance: Improving grooming  Eye Contact::  Good  Speech:  Normal Rate409  Volume:  Normal  Mood:  Reports she is feeling better, describes mood is improved  Affect:  More appropriate, reactive, smiles at times appropriately during session  Thought Process:  Linear and Descriptions of Associations: Intact  Orientation:  Full (Time, Place, and Person)  Thought Content:  No hallucinations, no delusions, currently future oriented  Suicidal Thoughts:  No denies any suicidal or self-injurious ideations, denies homicidal or violent ideations  Homicidal Thoughts:  No  Memory:  Recent and remote grossly intact  Judgement:  Other:  Improving  Insight:  Improving  Psychomotor Activity:  Normal no current psychomotor agitation or restlessness  Concentration:  Good  Recall:  Good  Fund of Knowledge:Good  Language: Good  Akathisia:  Negative  Handed:  Right  AIMS (if indicated):     Assets:  Communication Skills Desire for Improvement Resilience  Sleep:  Number of Hours: 6.75  Cognition: WNL  ADL's:  Intact   Mental Status Per Nursing Assessment::   On Admission:  Suicidal ideation indicated by  patient  Demographic Factors:  32, single, no children, lives alone  Loss Factors: Reported recently relocating from [place] to [place], recently quit her job, relationship stressors with mother.  Historical Factors: History of alcohol use disorder, past history of polysubstance dependence (has remained abstinent from drugs other than alcohol for several years), history of depression.  History of suicide attempt in 2010.  Risk Reduction Factors:   Sense of responsibility to family, Positive social support and Positive coping skills or problem solving skills  Continued Clinical Symptoms:  At this time patient is alert, attentive, calm, pleasant on approach, reports feeling significantly better than she did on admission, he is presenting with improved mood and a fuller/brighter range of affect.  No thought disorder.  Denies suicidal ideations, denies homicidal ideations, no hallucinations, no delusions, future oriented.  States her plan is to "go home", reports she is looking forward to seeing her pet dog, seeing family. She states she will go to rehab setting when a bed becomes available. Currently denies cravings and states current medication regimen is helping significantly in decreasing cravings and thoughts of drinking. Behavior on unit is calm and in good control, pleasant on approach, visible in milieu. Denies medication side effects at this time.  Side effects have been reviewed, including risk of sedation and opiate blocking properties of naltrexone She is not presenting with residual withdrawal symptoms at this time. *Today I have (with patient's expressed consent) spoken with her mother, with patient's health insurance representative, and with intake coordinator at [facility] .  In brief, mother has been actively involved in patient's case and disposition plan and has been trying for patient to be  accepted to [facility] for residential rehab . Rehab would be an appropriate  disposition plan to further help patient work on early recovery and relapse prevention.  She has requested that I make contact as above to help clarify situation.  As per [facility] intake coordinator, beds are available and patient may be accepted pending insurance coverage.  As per Contractor, [facility] is out of network and case is being reviewed , with the possibility she may be covered out of network if approved.  In this case insurance will promptly inform patient and [facility] .  Patient states that she is "feeling  better and ready to go home".  States " I I will go to rehab if the insurance covers it".  Patient also reports that mother is planning to stay with her  following her discharge for added support. Patient is also being assessed by Dr. Jeannine Kitten for second opinion, he concurs that patient is clear for discharge and that there are no grounds for involuntary commitment at this time.  Cognitive Features That Contribute To Risk:  No gross cognitive deficits noted upon discharge. Is alert , attentive, and oriented x 3   Suicide Risk:  Mild   Follow-up Information    BEHAVIORAL HEALTH PARTIAL HOSPITALIZATION PROGRAM. Call on 02/03/2019.   Specialty: Behavioral Health Why: You will receive a telephone call this afternoon to reschedule your initial [facility] WebEx appointment. Please be sure to check your email for the required link at discharge.  If you have any questions or concerns, please contact [phone] option 3. Contact information: [address] [id number] mc [place] [address] [phone]          Plan Of Care/Follow-up recommendations:  Activity:  As tolerated Diet:  Regular Tests:  NA Other:  See below Patient is expressing readiness for discharge.  There are no current grounds for involuntary commitment.  She is leaving in good spirits.  She is being referred to partial hospital program.  As above, she also expresses interest in going to [facility] ( rehab setting ) later this week pending insurance verification. States mother will be picking her up later today and will be staying with her for added support. I have encouraged her to consider 12-step/AA participation. Patient to follow-up with PCP for medical management/issues as needed. Craige Cotta, MD 02/03/2019, 4:18 PM

## 2019-02-04 ENCOUNTER — Telehealth (HOSPITAL_COMMUNITY): Payer: Self-pay | Admitting: Professional

## 2019-02-05 ENCOUNTER — Telehealth (HOSPITAL_COMMUNITY): Payer: Self-pay | Admitting: Professional

## 2019-05-01 ENCOUNTER — Emergency Department (HOSPITAL_COMMUNITY)
Admission: EM | Admit: 2019-05-01 | Discharge: 2019-05-02 | Disposition: A | Discharge status: Home / Self Care | Payer: BLUE CROSS/BLUE SHIELD | Attending: Emergency Medicine | Admitting: Emergency Medicine

## 2019-05-01 ENCOUNTER — Emergency Department (HOSPITAL_COMMUNITY): Payer: BLUE CROSS/BLUE SHIELD

## 2019-05-01 ENCOUNTER — Encounter (HOSPITAL_COMMUNITY): Payer: Self-pay | Admitting: Emergency Medicine

## 2019-05-01 ENCOUNTER — Other Ambulatory Visit: Payer: Self-pay

## 2019-05-01 DIAGNOSIS — M79642 Pain in left hand: Secondary | ICD-10-CM | POA: Insufficient documentation

## 2019-05-01 DIAGNOSIS — M3210 Systemic lupus erythematosus, organ or system involvement unspecified: Secondary | ICD-10-CM | POA: Insufficient documentation

## 2019-05-01 DIAGNOSIS — F332 Major depressive disorder, recurrent severe without psychotic features: Secondary | ICD-10-CM | POA: Insufficient documentation

## 2019-05-01 DIAGNOSIS — F1024 Alcohol dependence with alcohol-induced mood disorder: Secondary | ICD-10-CM | POA: Diagnosis present

## 2019-05-01 DIAGNOSIS — F1290 Cannabis use, unspecified, uncomplicated: Secondary | ICD-10-CM | POA: Diagnosis not present

## 2019-05-01 DIAGNOSIS — Z20822 Contact with and (suspected) exposure to covid-19: Secondary | ICD-10-CM | POA: Insufficient documentation

## 2019-05-01 DIAGNOSIS — Z79899 Other long term (current) drug therapy: Secondary | ICD-10-CM | POA: Diagnosis not present

## 2019-05-01 DIAGNOSIS — R45851 Suicidal ideations: Secondary | ICD-10-CM | POA: Diagnosis not present

## 2019-05-01 DIAGNOSIS — Y908 Blood alcohol level of 240 mg/100 ml or more: Secondary | ICD-10-CM | POA: Insufficient documentation

## 2019-05-01 LAB — CBC WITH DIFFERENTIAL/PLATELET
Abs Immature Granulocytes: 0.05 10*3/uL (ref 0.00–0.07)
Basophils Absolute: 0.1 10*3/uL (ref 0.0–0.1)
Basophils Relative: 1 %
Eosinophils Absolute: 0 10*3/uL (ref 0.0–0.5)
Eosinophils Relative: 0 %
HCT: 42.1 % (ref 36.0–46.0)
Hemoglobin: 14.1 g/dL (ref 12.0–15.0)
Immature Granulocytes: 1 %
Lymphocytes Relative: 32 %
Lymphs Abs: 2.7 10*3/uL (ref 0.7–4.0)
MCH: 31.4 pg (ref 26.0–34.0)
MCHC: 33.5 g/dL (ref 30.0–36.0)
MCV: 93.8 fL (ref 80.0–100.0)
Monocytes Absolute: 0.4 10*3/uL (ref 0.1–1.0)
Monocytes Relative: 5 %
Neutro Abs: 5.3 10*3/uL (ref 1.7–7.7)
Neutrophils Relative %: 61 %
Platelets: 256 10*3/uL (ref 150–400)
RBC: 4.49 MIL/uL (ref 3.87–5.11)
RDW: 16.6 % — ABNORMAL HIGH (ref 11.5–15.5)
WBC: 8.5 10*3/uL (ref 4.0–10.5)
nRBC: 0 % (ref 0.0–0.2)

## 2019-05-01 LAB — COMPREHENSIVE METABOLIC PANEL
ALT: 18 U/L (ref 0–44)
AST: 21 U/L (ref 15–41)
Albumin: 4.6 g/dL (ref 3.5–5.0)
Alkaline Phosphatase: 92 U/L (ref 38–126)
Anion gap: 15 (ref 5–15)
BUN: 13 mg/dL (ref 6–20)
CO2: 20 mmol/L — ABNORMAL LOW (ref 22–32)
Calcium: 8.9 mg/dL (ref 8.9–10.3)
Chloride: 107 mmol/L (ref 98–111)
Creatinine, Ser: 0.65 mg/dL (ref 0.44–1.00)
GFR calc Af Amer: >60 mL/min (ref >60)
GFR calc non Af Amer: >60 mL/min (ref >60)
Glucose, Bld: 129 mg/dL — ABNORMAL HIGH (ref 70–99)
Potassium: 4 mmol/L (ref 3.5–5.1)
Sodium: 142 mmol/L (ref 135–145)
Total Bilirubin: 0.7 mg/dL (ref 0.3–1.2)
Total Protein: 8.2 g/dL — ABNORMAL HIGH (ref 6.5–8.1)

## 2019-05-01 LAB — RESPIRATORY PANEL BY RT PCR (FLU A&B, COVID)
Influenza A by PCR: NEGATIVE
Influenza B by PCR: NEGATIVE
SARS Coronavirus 2 by RT PCR: NEGATIVE

## 2019-05-01 LAB — I-STAT BETA HCG BLOOD, ED (MC, WL, AP ONLY): I-stat hCG, quantitative: <5 m[IU]/mL (ref <5)

## 2019-05-01 LAB — ACETAMINOPHEN LEVEL: Acetaminophen (Tylenol), Serum: <10 ug/mL — ABNORMAL LOW (ref 10–30)

## 2019-05-01 LAB — SALICYLATE LEVEL: Salicylate Lvl: <7 mg/dL — ABNORMAL LOW (ref 7.0–30.0)

## 2019-05-01 LAB — ETHANOL: Alcohol, Ethyl (B): 255 mg/dL — ABNORMAL HIGH (ref <10)

## 2019-05-01 MED ORDER — ALBUTEROL SULFATE HFA 108 (90 BASE) MCG/ACT IN AERS: 2.0000 | INHALATION_SPRAY | Freq: Four times a day (QID) | RESPIRATORY_TRACT | Status: DC | PRN

## 2019-05-01 MED ORDER — ADULT MULTIVITAMIN W/MINERALS CH
1.0000 | ORAL_TABLET | Freq: Every day | ORAL | Status: DC
  Administered 2019-05-01 – 2019-05-02 (×2): 1 via ORAL
  Filled 2019-05-01 (×2): qty 1

## 2019-05-01 MED ORDER — DOCUSATE SODIUM 100 MG PO CAPS
200.0000 mg | ORAL_CAPSULE | Freq: Every day | ORAL | Status: DC
  Administered 2019-05-01: 200 mg via ORAL
  Filled 2019-05-01 (×2): qty 2

## 2019-05-01 MED ORDER — QUETIAPINE FUMARATE 300 MG PO TABS
400.0000 mg | ORAL_TABLET | Freq: Every day | ORAL | Status: DC
  Administered 2019-05-01: 400 mg via ORAL
  Filled 2019-05-01: qty 1

## 2019-05-01 MED ORDER — VENLAFAXINE HCL ER 75 MG PO CP24
150.0000 mg | ORAL_CAPSULE | Freq: Every day | ORAL | Status: DC
  Administered 2019-05-02: 08:00:00 150 mg via ORAL
  Filled 2019-05-01: qty 2

## 2019-05-01 MED ORDER — HYDROXYZINE HCL 25 MG PO TABS
25.0000 mg | ORAL_TABLET | Freq: Three times a day (TID) | ORAL | Status: DC | PRN
  Administered 2019-05-01: 25 mg via ORAL
  Filled 2019-05-01: qty 1

## 2019-05-01 MED ORDER — GABAPENTIN 400 MG PO CAPS
800.0000 mg | ORAL_CAPSULE | Freq: Three times a day (TID) | ORAL | Status: DC
  Administered 2019-05-01 – 2019-05-02 (×2): 800 mg via ORAL
  Filled 2019-05-01 (×2): qty 2

## 2019-05-01 NOTE — Progress Notes (Signed)
Per [place], Junior [employer], RN pt is sleeping and unable to be aroused. ED staff will call TTS when the pt is alert and able to participate.

## 2019-05-01 NOTE — ED Triage Notes (Signed)
Pt arriving with police under IVC filed by her mother.

## 2019-05-01 NOTE — ED Notes (Signed)
IVC papers reports "Severe anxiety, depression, and alcoholism. Respondent told the petitioner that she wanted to overdose on heroin that she has in her home. She has been abusive and combative with officers."
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
## 2019-05-01 NOTE — ED Notes (Signed)
Pt has a belongings bag in the cabinet behind nursing station 9-25

## 2019-05-02 LAB — RAPID URINE DRUG SCREEN, HOSP PERFORMED
Amphetamines: NOT DETECTED
Barbiturates: NOT DETECTED
Benzodiazepines: NOT DETECTED
Cocaine: NOT DETECTED
Opiates: NOT DETECTED
Tetrahydrocannabinol: POSITIVE — AB

## 2019-05-02 NOTE — ED Notes (Signed)
Pt gave written permission for [place] to speak with her mother. She has been calm and cooperative.

## 2019-05-02 NOTE — Discharge Instructions (Signed)
For your behavioral health needs, you are advised to continue treatment with your current outpatient providers. °

## 2019-05-02 NOTE — BH Assessment (Signed)
[facility] Assessment Progress Note  Per Berneice Heinrich, FNP, this pt does not require psychiatric hospitalization at this time.  Pt presents under IVC initiated by pt's mother and upheld by EDP Lorre Nick, MD, which has been rescinded by Nelly Rout, MD.  Inetta Fermo reports that pt already has extensive outpatient services in place.  Discharge instructions advise pt to continue treatment with these providers.  Pt's nurse, Diane, has been notified.  Doylene Canning, MA Triage Specialist [phone]

## 2019-05-02 NOTE — BH Assessment (Signed)
Clinician contacted the Charge Nurse, Leta Jungling RN, who checked with Junior RN that pt continues to sleep and is unable to participate in her [facility] Assessment at this time.

## 2019-05-02 NOTE — BHH Suicide Risk Assessment (Cosign Needed)
Suicide Risk Assessment  Discharge Assessment   [facility] Discharge Suicide Risk Assessment   Principal Problem: Alcohol dependence with alcohol-induced mood disorder [facility]) Discharge Diagnoses: Principal Problem:   Alcohol dependence with alcohol-induced mood disorder (HCC)   Total Time spent with patient: 30 minutes  Musculoskeletal: Strength & Muscle Tone: within normal limits Gait & Station: normal Patient leans: N/A  Psychiatric Specialty Exam:   Blood pressure 102/67, pulse (!) 109, temperature 98 F (36.7 C), resp. rate 20, height 5\' 5"  (1.651 m), weight 63.5 kg, last menstrual period 05/01/2019, SpO2 93 %.Body mass index is 23.3 kg/m.  General Appearance: Casual and Fairly Groomed  Eye Contact::  Good  Speech:  Clear and Coherent and Normal Rate409  Volume:  Normal  Mood:  Euthymic  Affect:  Appropriate and Congruent  Thought Process:  Coherent, Goal Directed and Descriptions of Associations: Intact  Orientation:  Full (Time, Place, and Person)  Thought Content:  WDL and Logical  Suicidal Thoughts:  No  Homicidal Thoughts:  No  Memory:  Immediate;   Good Recent;   Good Remote;   Good  Judgement:  Good  Insight:  Good  Psychomotor Activity:  Normal  Concentration:  Good  Recall:  Good  Fund of Knowledge:Good  Language: Good  Akathisia:  No  Handed:  Right  AIMS (if indicated):     Assets:  Communication Skills Desire for Improvement Financial Resources/Insurance Housing Intimacy Leisure Time Physical Health Resilience Social Support Talents/Skills  Sleep:     Cognition: WNL  ADL's:  Intact   Mental Status Per Nursing Assessment::   On Admission:   Patient assessed by nurse practitioner, along with Dr. [phone].  Patient states "I am a recovering addict I have been off of heroin for 11 years my mom called the police yesterday because I made a comment that I want to use." Patient denies suicidal and homicidal ideations.  Patient denies history of self-harm.   Patient denies access to weapons.  Patient denies auditory and visual hallucinations. Patient reports she lives alone.  Patient seen by outpatient psychiatry Dr. Lucianne Muss at [facility]  Patient outpatient therapist, [facility] fields. Collateral information collected from mother, no safety concerns noted.  Demographic Factors:  NA  Loss Factors: NA  Historical Factors: NA  Risk Reduction Factors:   Living with another person, especially a relative, Positive social support, Positive therapeutic relationship and Positive coping skills or problem solving skills  Continued Clinical Symptoms:  Alcohol/Substance Abuse/Dependencies  Cognitive Features That Contribute To Risk:  None    Suicide Risk:  Minimal: No identifiable suicidal ideation.  Patients presenting with no risk factors but with morbid ruminations; may be classified as minimal risk based on the severity of the depressive symptoms    Plan Of Care/Follow-up recommendations:  Peers support consult ordered and completed. Other:  Follow up with outpatient psychiatry and substance use resources  Lelon Mast, FNP 05/02/2019, 1:07 PM
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
## 2019-05-02 NOTE — ED Notes (Signed)
Pt discharged home. Discharged instructions read to pt who verbalized understanding. All belongings returned to pt who signed for same. Denies SI/HI, is not delusional and not responding to internal stimuli. Escorted pt to the ED exit.    

## 2019-05-02 NOTE — BH Assessment (Signed)
[facility] Assessment Progress Note This Clinical research associate spoke to patient's mother Ji Fairburn [phone] with patient's permission, who reported she had no safety concerns in reference to patient being discharged and self harming. Patient's mother did voice concerns in reference to patient's ongoing SA issues. This Clinical research associate informed mother that patient's SA issues and concerns have been addressed with patient in reference to maintaining her sobriety and harm reduction. Patient states she will follow up with her current provider to address recovery issues. Patient will also be provided with supplemental resources on discharge.

## 2019-05-02 NOTE — Progress Notes (Signed)
Received Alison Koch from the main ED with the nurse and security. She was oriented to her new environment and the sitter remained at the bedside.She drifted back off to sleep.

## 2019-05-02 NOTE — Patient Outreach (Cosign Needed Addendum)
[facility] met with the patient in order to provide substance use recovery support and provide information for substance use recovery resources. 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 Patient also reports a history of occasional alcohol use three to four times a week. Patient is passionate about her substance use recovery process. Patient is interested in attending in-person AA or NA meetings. [facility] talked to the patient about available in-person 12-step meetings in [place] and provided the patient with an in-person/online AA/NA meeting list. CPSS also provided information for several additional substance use recovery resources including residential/outpatient substance use treatment center list, [facility] house vacancy list/flier detailing [place], [place] who help with placement to substance use treatment centers across the [place] and [facility] contact information. CPSS strongly encouraged the patient to follow up with [facility] if needed for further substance use recovery support or any further help with getting connected to substance use treatment resources. Patient was pleasant, cooperative, and thanked [facility] for the [facility] substance use recovery services provided.

## 2019-11-05 ENCOUNTER — Emergency Department (HOSPITAL_COMMUNITY): Payer: BLUE CROSS/BLUE SHIELD

## 2019-11-05 ENCOUNTER — Emergency Department (HOSPITAL_COMMUNITY)
Admission: EM | Admit: 2019-11-05 | Discharge: 2019-11-05 | Disposition: A | Discharge status: Home / Self Care | Payer: BLUE CROSS/BLUE SHIELD | Attending: Emergency Medicine | Admitting: Emergency Medicine

## 2019-11-05 ENCOUNTER — Encounter (HOSPITAL_COMMUNITY): Payer: Self-pay | Admitting: Emergency Medicine

## 2019-11-05 DIAGNOSIS — Z79899 Other long term (current) drug therapy: Secondary | ICD-10-CM | POA: Insufficient documentation

## 2019-11-05 DIAGNOSIS — I959 Hypotension, unspecified: Secondary | ICD-10-CM | POA: Diagnosis not present

## 2019-11-05 DIAGNOSIS — E876 Hypokalemia: Secondary | ICD-10-CM | POA: Insufficient documentation

## 2019-11-05 DIAGNOSIS — S2231XA Fracture of one rib, right side, initial encounter for closed fracture: Secondary | ICD-10-CM | POA: Insufficient documentation

## 2019-11-05 DIAGNOSIS — M79604 Pain in right leg: Secondary | ICD-10-CM | POA: Insufficient documentation

## 2019-11-05 DIAGNOSIS — R519 Headache, unspecified: Secondary | ICD-10-CM | POA: Insufficient documentation

## 2019-11-05 DIAGNOSIS — R55 Syncope and collapse: Secondary | ICD-10-CM | POA: Diagnosis present

## 2019-11-05 DIAGNOSIS — Y939 Activity, unspecified: Secondary | ICD-10-CM | POA: Diagnosis not present

## 2019-11-05 DIAGNOSIS — K7460 Unspecified cirrhosis of liver: Secondary | ICD-10-CM | POA: Diagnosis not present

## 2019-11-05 DIAGNOSIS — Y9289 Other specified places as the place of occurrence of the external cause: Secondary | ICD-10-CM | POA: Diagnosis not present

## 2019-11-05 DIAGNOSIS — Y999 Unspecified external cause status: Secondary | ICD-10-CM | POA: Insufficient documentation

## 2019-11-05 LAB — I-STAT BETA HCG BLOOD, ED (MC, WL, AP ONLY): I-stat hCG, quantitative: <5 m[IU]/mL (ref <5)

## 2019-11-05 LAB — CBC WITH DIFFERENTIAL/PLATELET
Abs Immature Granulocytes: 0.05 10*3/uL (ref 0.00–0.07)
Basophils Absolute: 0 10*3/uL (ref 0.0–0.1)
Basophils Relative: 0 %
Eosinophils Absolute: 0 10*3/uL (ref 0.0–0.5)
Eosinophils Relative: 0 %
HCT: 39.1 % (ref 36.0–46.0)
Hemoglobin: 12.9 g/dL (ref 12.0–15.0)
Immature Granulocytes: 1 %
Lymphocytes Relative: 23 %
Lymphs Abs: 2 10*3/uL (ref 0.7–4.0)
MCH: 30.4 pg (ref 26.0–34.0)
MCHC: 33 g/dL (ref 30.0–36.0)
MCV: 92 fL (ref 80.0–100.0)
Monocytes Absolute: 0.4 10*3/uL (ref 0.1–1.0)
Monocytes Relative: 4 %
Neutro Abs: 6.4 10*3/uL (ref 1.7–7.7)
Neutrophils Relative %: 72 %
Platelets: 206 10*3/uL (ref 150–400)
RBC: 4.25 MIL/uL (ref 3.87–5.11)
RDW: 13.8 % (ref 11.5–15.5)
WBC: 8.8 10*3/uL (ref 4.0–10.5)
nRBC: 0 % (ref 0.0–0.2)

## 2019-11-05 LAB — COMPREHENSIVE METABOLIC PANEL
ALT: 13 U/L (ref 0–44)
AST: 15 U/L (ref 15–41)
Albumin: 3.3 g/dL — ABNORMAL LOW (ref 3.5–5.0)
Alkaline Phosphatase: 43 U/L (ref 38–126)
Anion gap: 14 (ref 5–15)
BUN: 5 mg/dL — ABNORMAL LOW (ref 6–20)
CO2: 21 mmol/L — ABNORMAL LOW (ref 22–32)
Calcium: 7.5 mg/dL — ABNORMAL LOW (ref 8.9–10.3)
Chloride: 109 mmol/L (ref 98–111)
Creatinine, Ser: 0.53 mg/dL (ref 0.44–1.00)
GFR calc Af Amer: >60 mL/min (ref >60)
GFR calc non Af Amer: >60 mL/min (ref >60)
Glucose, Bld: 69 mg/dL — ABNORMAL LOW (ref 70–99)
Potassium: 2.8 mmol/L — ABNORMAL LOW (ref 3.5–5.1)
Sodium: 144 mmol/L (ref 135–145)
Total Bilirubin: 0.2 mg/dL — ABNORMAL LOW (ref 0.3–1.2)
Total Protein: 6.1 g/dL — ABNORMAL LOW (ref 6.5–8.1)

## 2019-11-05 LAB — CBG MONITORING, ED
Glucose-Capillary: 58 mg/dL — ABNORMAL LOW (ref 70–99)
Glucose-Capillary: 123 mg/dL — ABNORMAL HIGH (ref 70–99)

## 2019-11-05 LAB — ETHANOL: Alcohol, Ethyl (B): 194 mg/dL — ABNORMAL HIGH (ref <10)

## 2019-11-05 MED ORDER — KETOROLAC TROMETHAMINE 15 MG/ML IJ SOLN
15.0000 mg | Freq: Once | INTRAMUSCULAR | Status: AC
  Administered 2019-11-05: 15 mg via INTRAVENOUS
  Filled 2019-11-05: qty 1

## 2019-11-05 MED ORDER — DEXTROSE 50 % IV SOLN
1.0000 | Freq: Once | INTRAVENOUS | Status: AC
  Administered 2019-11-05: 50 mL via INTRAVENOUS
  Filled 2019-11-05: qty 50

## 2019-11-05 MED ORDER — ACETAMINOPHEN 325 MG PO TABS
650.0000 mg | ORAL_TABLET | Freq: Once | ORAL | Status: AC
  Administered 2019-11-05: 650 mg via ORAL
  Filled 2019-11-05: qty 2

## 2019-11-05 MED ORDER — POTASSIUM CHLORIDE 10 MEQ/100ML IV SOLN
10.0000 meq | Freq: Once | INTRAVENOUS | Status: AC
  Administered 2019-11-05: 10 meq via INTRAVENOUS
  Filled 2019-11-05: qty 100

## 2019-11-05 MED ORDER — IOHEXOL 300 MG/ML  SOLN
100.0000 mL | Freq: Once | INTRAMUSCULAR | Status: AC | PRN
  Administered 2019-11-05: 100 mL via INTRAVENOUS

## 2019-11-05 MED ORDER — POTASSIUM CHLORIDE CRYS ER 20 MEQ PO TBCR
30.0000 meq | EXTENDED_RELEASE_TABLET | Freq: Once | ORAL | Status: AC
  Administered 2019-11-05: 30 meq via ORAL
  Filled 2019-11-05: qty 1

## 2019-11-05 MED ORDER — LIDOCAINE 5 % EX PTCH
1.0000 | MEDICATED_PATCH | Freq: Once | CUTANEOUS | Status: DC
  Administered 2019-11-05: 1 via TRANSDERMAL
  Filled 2019-11-05: qty 1

## 2019-11-05 MED ORDER — SODIUM CHLORIDE 0.9 % IV BOLUS
1000.0000 mL | Freq: Once | INTRAVENOUS | Status: AC
  Administered 2019-11-05: 1000 mL via INTRAVENOUS

## 2019-11-05 MED ORDER — NAPROXEN 375 MG PO TABS: 375.0000 mg | ORAL_TABLET | Freq: Two times a day (BID) | ORAL | 0 refills | Status: AC
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
## 2019-11-05 NOTE — Discharge Instructions (Signed)
Prescription sent to pharmacy for naproxen.  This is an anti-inflammatory.  Take as prescribed.  Take it with food so it does not cause an upset stomach.  Do not take any additional Aleve, Motrin, Advil because these medicines are similar.  Your potassium level was low today. This is likely related to not eating a normal diet. You received potassium here in the emergency department. You should eat foods that are rich in potassium. I have included information about low potassium in your discharge paperwork.  You will likely be sore for the next 7 to 10 days.  You should try applying ice or a heating pad for your aches and pains.  Follow-up with your primary care doctor in 2 to 5 days for symptom recheck.  Return to the emergency room for any new or worsening symptoms.
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
## 2021-04-26 ENCOUNTER — Emergency Department (HOSPITAL_COMMUNITY): Payer: No Typology Code available for payment source

## 2021-04-26 ENCOUNTER — Encounter (HOSPITAL_COMMUNITY): Payer: Self-pay

## 2021-04-26 ENCOUNTER — Other Ambulatory Visit: Payer: Self-pay

## 2021-04-26 ENCOUNTER — Emergency Department (HOSPITAL_COMMUNITY)
Admission: EM | Admit: 2021-04-26 | Discharge: 2021-04-26 | Disposition: A | Discharge status: Home / Self Care | Payer: No Typology Code available for payment source | Attending: Emergency Medicine | Admitting: Emergency Medicine

## 2021-04-26 DIAGNOSIS — R000 Tachycardia, unspecified: Secondary | ICD-10-CM | POA: Insufficient documentation

## 2021-04-26 DIAGNOSIS — R002 Palpitations: Secondary | ICD-10-CM

## 2021-04-26 LAB — CBC
HCT: 40.3 % (ref 36.0–46.0)
Hemoglobin: 13.4 g/dL (ref 12.0–15.0)
MCH: 31.7 pg (ref 26.0–34.0)
MCHC: 33.3 g/dL (ref 30.0–36.0)
MCV: 95.3 fL (ref 80.0–100.0)
Platelets: 151 10*3/uL (ref 150–400)
RBC: 4.23 MIL/uL (ref 3.87–5.11)
RDW: 14.6 % (ref 11.5–15.5)
WBC: 5.5 10*3/uL (ref 4.0–10.5)
nRBC: 0 % (ref 0.0–0.2)

## 2021-04-26 LAB — COMPREHENSIVE METABOLIC PANEL
ALT: 32 U/L (ref 0–44)
AST: 55 U/L — ABNORMAL HIGH (ref 15–41)
Albumin: 4 g/dL (ref 3.5–5.0)
Alkaline Phosphatase: 70 U/L (ref 38–126)
Anion gap: 15 (ref 5–15)
BUN: 8 mg/dL (ref 6–20)
CO2: 22 mmol/L (ref 22–32)
Calcium: 8.7 mg/dL — ABNORMAL LOW (ref 8.9–10.3)
Chloride: 99 mmol/L (ref 98–111)
Creatinine, Ser: 0.67 mg/dL (ref 0.44–1.00)
GFR, Estimated: >60 mL/min (ref >60)
Glucose, Bld: 120 mg/dL — ABNORMAL HIGH (ref 70–99)
Potassium: 3.7 mmol/L (ref 3.5–5.1)
Sodium: 136 mmol/L (ref 135–145)
Total Bilirubin: 0.3 mg/dL (ref 0.3–1.2)
Total Protein: 6.9 g/dL (ref 6.5–8.1)

## 2021-04-26 LAB — TSH: TSH: 1.284 u[IU]/mL (ref 0.350–4.500)

## 2021-04-26 LAB — I-STAT BETA HCG BLOOD, ED (MC, WL, AP ONLY): I-stat hCG, quantitative: <5 m[IU]/mL (ref <5)

## 2021-04-26 LAB — TROPONIN I (HIGH SENSITIVITY): Troponin I (High Sensitivity): 4 ng/L (ref <18)

## 2021-04-26 LAB — D-DIMER, QUANTITATIVE: D-Dimer, Quant: 0.66 ug/mL-FEU — ABNORMAL HIGH (ref 0.00–0.50)

## 2021-04-26 MED ORDER — IOHEXOL 350 MG/ML SOLN
50.0000 mL | Freq: Once | INTRAVENOUS | Status: AC | PRN
  Administered 2021-04-26: 50 mL via INTRAVENOUS

## 2021-04-26 MED ORDER — SODIUM CHLORIDE 0.9 % IV BOLUS
1000.0000 mL | Freq: Once | INTRAVENOUS | Status: AC
  Administered 2021-04-26: 1000 mL via INTRAVENOUS

## 2021-04-26 NOTE — ED Provider Notes (Signed)
Patient handed off to me at 7 AM.  Awaiting CT scan of her chest to rule out blood clot.  Having some palpitations.  CT scan per radiology review is unremarkable.  No PE.  No pneumonia.  Vital signs are unremarkable.  Lab work showed no significant anemia, electrolyte abnormality.  Normal troponin.  Overall suspect symptomatic palpitations versus anxiety.  We will have her follow-up with primary care doctor/cardiology.  Discharged in good condition.  This chart was dictated using voice recognition software.  Despite best efforts to proofread,  errors can occur which can change the documentation meaning.    Virgina Norfolk, DO 04/26/21 [phone]

## 2021-04-26 NOTE — ED Provider Notes (Signed)
[facility]EMERGENCY DEPT [facility] Emergency Department Provider Note MRN:  [id number]  Arrival date & time: 04/26/21     Chief Complaint   Palpitations   History of Present Illness   Alison Koch is a 36 y.o. year-old female with a history of lupus presenting to the ED with chief complaint of palpitations.  Palpitations and shortness of breath starting this evening when lying in bed.  No chest pain, no leg pain or swelling, no abdominal pain.  No recent fever or illness.  Review of Systems  A thorough review of systems was obtained and all systems are negative except as noted in the HPI and PMH.   Patient's Health History    Past Medical History:  Diagnosis Date   Lupus (HCC)     History reviewed. No pertinent surgical history.  No family history on file.  Social History   Socioeconomic History   Marital status: Single    Spouse name: Not on file   Number of children: Not on file   Years of education: Not on file   Highest education level: Not on file  Occupational History   Not on file  Tobacco Use   Smoking status: Never   Smokeless tobacco: Never  Substance and Sexual Activity   Alcohol use: Not on file   Drug use: Not on file   Sexual activity: Not on file  Other Topics Concern   Not on file  Social History Narrative   Not on file   Social Determinants of Health   Financial Resource Strain: Not on file  Food Insecurity: Not on file  Transportation Needs: Not on file  Physical Activity: Not on file  Stress: Not on file  Social Connections: Not on file  Intimate Partner Violence: Not on file     Physical Exam   Vitals:   04/26/21 0515 04/26/21 0545  BP: 125/87 122/84  Pulse: (!) 114   Resp: 17 (!) 24  Temp: 99 F (37.2 C)   SpO2: 98%     CONSTITUTIONAL: Well-appearing, NAD NEURO/PSYCH:  Alert and oriented x 3, no focal deficits EYES:  eyes equal and reactive ENT/NECK:  no LAD, no JVD CARDIO: Tachycardic rate, well-perfused, normal S1 and  S2 PULM:  CTAB no wheezing or rhonchi GI/GU:  non-distended, non-tender MSK/SPINE:  No gross deformities, no edema SKIN:  no rash, atraumatic   *Additional and/or pertinent findings included in MDM below  Diagnostic and Interventional Summary    EKG Interpretation  Date/Time:  Tuesday April 26 2021 05:19:12 EST Ventricular Rate:  114 PR Interval:  134 QRS Duration: 86 QT Interval:  310 QTC Calculation: 427 R Axis:   65 Text Interpretation: Sinus tachycardia Otherwise normal ECG When compared with ECG of 05-Nov-2019 19:06, PREVIOUS ECG IS PRESENT Confirmed by Kennis Carina [phone]) on 04/26/2021 5:32:30 AM       Labs Reviewed  CBC  COMPREHENSIVE METABOLIC PANEL  D-DIMER, QUANTITATIVE  TSH  I-STAT BETA HCG BLOOD, ED ([facility], [facility], [facility] ONLY)  TROPONIN I (HIGH SENSITIVITY)    DG Chest Port 1 View  Final Result      Medications  sodium chloride 0.9 % bolus 1,000 mL (1,000 mLs Intravenous New Bag/Given 04/26/21 0610)     Procedures  /  Critical Care Procedures  ED Course and Medical Decision Making  Initial Impression and Ddx DDx includes anemia, electrolyte disturbance, PE, hyperthyroidism  Past medical/surgical history that increases complexity of ED encounter: Lupus  Interpretation of Diagnostics I personally reviewed the EKG  and my interpretation is as follows: Sinus rhythm, tachycardia, S1 Q3 T3 pattern      Patient Reassessment and Ultimate Disposition/Management Awaiting labs, reassessment.  Signed out to oncoming provider.  Patient management required discussion with the following services or consulting groups:  None  Complexity of Problems Addressed Acute illness or injury that poses threat of life of bodily function  Additional Data Reviewed and Analyzed Further history obtained from: Recent Consult notes  Additional Factors Impacting ED Encounter Risk None  Barth Kirks. Sedonia Small, [place] [email or web address]  Final Clinical Impressions(s) / ED Diagnoses     ICD-10-CM   1. Palpitations  R00.2       ED Discharge Orders     None        Discharge Instructions Discussed with and Provided to Patient:   Discharge Instructions   None      Maudie Flakes, MD 04/26/21 [phone]

## 2021-04-26 NOTE — ED Notes (Signed)
[phone] Alison Koch pt mother would like a update.

## 2021-04-26 NOTE — ED Triage Notes (Signed)
Pt reports palpitations and tachycardia beginning around midnight while laying in bed. Denies cp.

## 2021-04-26 NOTE — ED Notes (Signed)
Pt verbalizes understanding of discharge instructions. Opportunity for questions and answers were provided. Pt discharged from the ED.   ?

## 2022-05-01 NOTE — Progress Notes (Deleted)
$'@Patient'w$  ID: Alison Koch, female    DOB: [birth year], 37 y.o.   MRN: [id number]  No chief complaint on file.   Referring provider: Heywood Bene, *  HPI: 37 year old female, current smoker. PMH diabetes, anxiety/depression, anorexia nervosa, alcohol dependence, history of drug abuse, hypertension, cirrhosis of liver, elevated liver enzymes.   05/02/2022 Patient presents  Referred by primary care d/t difficulties sleeping. She has been told that she snores at night. She drinks alcohol seldomly. Smokes 1/2 ppd, not ready to quit smoking Requesting sleep study    Sleep questionnaire Symptoms-    Prior sleep study-  Bedtime- Time to fall asleep-  Nocturnal awakenings-  Out of bed/start of day-  Weight changes-  Do you operate heavy machinery-  Do you currently wear CPAP-  Do you current wear oxygen-  [place]- Medications-clonidine 0.2 mg daily, propranolol 10 mg twice daily as needed, amitriptyline 50 mg daily, Atarax 100 mg twice daily as needed   Allergies  Allergen Reactions   Buspirone Rash and Swelling    Swelling of "muscles" Swelling of "muscles"    Codeine Other (See Comments) and Rash    Dizziness, syncope  Dizziness, syncope Dizziness, syncope Dizziness, syncope    Vancomycin Hives, Itching, Rash and Swelling    Immunization History  Administered Date(s) Administered   Pneumococcal Polysaccharide-23 01/21/2019    Past Medical History:  Diagnosis Date   Lupus ([place])     Tobacco History: Social History   Tobacco Use  Smoking Status Never  Smokeless Tobacco Never   Counseling given: Not Answered   Outpatient Medications Prior to Visit  Medication Sig Dispense Refill   acamprosate (CAMPRAL) 333 MG tablet Take 2 tablets (666 mg total) by mouth 3 (three) times daily. 180 tablet 0   albuterol (VENTOLIN HFA) 108 (90 Base) MCG/ACT inhaler Inhale 2 puffs into the lungs every 6 (six) hours as needed for wheezing or shortness of breath.       docusate sodium (COLACE) 100 MG capsule Take 2 capsules (200 mg total) by mouth daily. (may buy from over the counter): for constipation (Patient not taking: Reported on 05/01/2019) 10 capsule 0   feeding supplement, ENSURE ENLIVE, (ENSURE ENLIVE) LIQD Take 237 mLs by mouth 2 (two) times daily between meals. 237 mL 0   gabapentin (NEURONTIN) 800 MG tablet Take 1 tablet (800 mg total) by mouth 3 (three) times daily. For agitation/substance withdrawal syndrome 90 tablet 0   hydrOXYzine (ATARAX/VISTARIL) 25 MG tablet Take 1 tablet (25 mg total) by mouth 3 (three) times daily as needed for anxiety. 30 tablet 0   JUNEL 1/20 1-20 MG-MCG tablet Take 1 tablet by mouth daily.     midodrine (PROAMATINE) 2.5 MG tablet Take 2.5 mg by mouth 3 (three) times daily.     Multiple Vitamin (MULTI-VITAMIN) tablet Take 1 tablet by mouth daily.     naltrexone (DEPADE) 50 MG tablet Take 1 tablet (50 mg total) by mouth daily. 30 tablet 0   nicotine polacrilex (NICORETTE) 2 MG gum Take 1 each (2 mg total) by mouth as needed for smoking cessation. (Patient not taking: Reported on 05/01/2019) 100 tablet 0   QUEtiapine (SEROQUEL) 25 MG tablet Take 1 tablet (25 mg total) by mouth every morning. 30 tablet 0   QUEtiapine (SEROQUEL) 400 MG tablet Take 1 tablet (400 mg total) by mouth at bedtime. 30 tablet 0   thiamine (VITAMIN B-1) 100 MG tablet Take 1 tablet (100 mg total) by mouth daily. For low  thiamine (Patient not taking: Reported on 05/01/2019) 30 tablet 0   traZODone (DESYREL) 100 MG tablet Take 100 mg by mouth at bedtime.     venlafaxine XR (EFFEXOR-XR) 150 MG 24 hr capsule Take 1 capsule (150 mg total) by mouth daily with breakfast. 30 capsule 0   No facility-administered medications prior to visit.      Review of Systems  Review of Systems   Physical Exam  There were no vitals taken for this visit. Physical Exam   Lab Results:  CBC    Component Value Date/Time   WBC 5.5 04/26/2021 0604   RBC 4.23  04/26/2021 0604   HGB 13.4 04/26/2021 0604   HCT 40.3 04/26/2021 0604   PLT 151 04/26/2021 0604   MCV 95.3 04/26/2021 0604   MCH 31.7 04/26/2021 0604   MCHC 33.3 04/26/2021 0604   RDW 14.6 04/26/2021 0604   LYMPHSABS 2.0 11/05/2019 1808   MONOABS 0.4 11/05/2019 1808   EOSABS 0.0 11/05/2019 1808   BASOSABS 0.0 11/05/2019 1808    BMET    Component Value Date/Time   NA 136 04/26/2021 0604   K 3.7 04/26/2021 0604   CL 99 04/26/2021 0604   CO2 22 04/26/2021 0604   GLUCOSE 120 (H) 04/26/2021 0604   BUN 8 04/26/2021 0604   CREATININE 0.67 04/26/2021 0604   CALCIUM 8.7 (L) 04/26/2021 0604   GFRNONAA >60 04/26/2021 0604   GFRAA >60 11/05/2019 1808    BNP No results found for: "BNP"  ProBNP No results found for: "PROBNP"  Imaging: No results found.   Assessment & Plan:   No problem-specific Assessment & Plan notes found for this encounter.     Martyn Ehrich, NP 05/01/2022

## 2022-05-02 ENCOUNTER — Institutional Professional Consult (permissible substitution): Payer: No Typology Code available for payment source | Admitting: Primary Care

## 2022-05-05 ENCOUNTER — Encounter: Payer: Self-pay | Admitting: Primary Care

## 2022-12-29 DIAGNOSIS — F3181 Bipolar II disorder: Secondary | ICD-10-CM | POA: Diagnosis not present

## 2022-12-29 DIAGNOSIS — F4310 Post-traumatic stress disorder, unspecified: Secondary | ICD-10-CM | POA: Diagnosis not present

## 2022-12-29 DIAGNOSIS — F411 Generalized anxiety disorder: Secondary | ICD-10-CM | POA: Diagnosis not present

## 2023-01-03 DIAGNOSIS — K529 Noninfective gastroenteritis and colitis, unspecified: Secondary | ICD-10-CM | POA: Diagnosis not present

## 2023-01-03 DIAGNOSIS — R112 Nausea with vomiting, unspecified: Secondary | ICD-10-CM | POA: Diagnosis not present

## 2023-01-03 DIAGNOSIS — R197 Diarrhea, unspecified: Secondary | ICD-10-CM | POA: Diagnosis not present

## 2023-01-27 DIAGNOSIS — U071 COVID-19: Secondary | ICD-10-CM | POA: Diagnosis not present

## 2023-01-27 DIAGNOSIS — R051 Acute cough: Secondary | ICD-10-CM | POA: Diagnosis not present

## 2023-02-09 DIAGNOSIS — F411 Generalized anxiety disorder: Secondary | ICD-10-CM | POA: Diagnosis not present

## 2023-02-09 DIAGNOSIS — F3181 Bipolar II disorder: Secondary | ICD-10-CM | POA: Diagnosis not present

## 2023-02-09 DIAGNOSIS — F4310 Post-traumatic stress disorder, unspecified: Secondary | ICD-10-CM | POA: Diagnosis not present

## 2023-03-05 ENCOUNTER — Telehealth: Payer: Self-pay | Admitting: General Practice

## 2023-03-05 NOTE — Telephone Encounter (Signed)
Tried calling pt to schedule her a new patient appt with Neale Burly but pt did not answer and voicemail was full. Jerrel Ivory is taking new patients but she doesn't have any openings for new patient appts until 04/17/2023 or after.   Copied from CRM [id number]. Topic: Appointments - Scheduling Inquiry for Clinic >> Mar 05, 2023 11:34 AM Desma Mcgregor wrote: Reason for CRM: Pt looking to become a patient of Dr. Ellamae Sia asap. She does have a rash on back of scalp for two months and needs to see a dermatologist. Please contact pt back if approved. [phone] or mom's # [phone]

## 2023-03-09 DIAGNOSIS — M5441 Lumbago with sciatica, right side: Secondary | ICD-10-CM | POA: Diagnosis not present

## 2023-03-09 DIAGNOSIS — L409 Psoriasis, unspecified: Secondary | ICD-10-CM | POA: Diagnosis not present

## 2023-03-13 DIAGNOSIS — F411 Generalized anxiety disorder: Secondary | ICD-10-CM | POA: Diagnosis not present

## 2023-03-13 DIAGNOSIS — F4310 Post-traumatic stress disorder, unspecified: Secondary | ICD-10-CM | POA: Diagnosis not present

## 2023-03-13 DIAGNOSIS — M545 Low back pain, unspecified: Secondary | ICD-10-CM | POA: Diagnosis not present

## 2023-03-13 DIAGNOSIS — M62830 Muscle spasm of back: Secondary | ICD-10-CM | POA: Diagnosis not present

## 2023-03-13 DIAGNOSIS — F3181 Bipolar II disorder: Secondary | ICD-10-CM | POA: Diagnosis not present

## 2023-03-23 DIAGNOSIS — F4310 Post-traumatic stress disorder, unspecified: Secondary | ICD-10-CM | POA: Diagnosis not present

## 2023-03-23 DIAGNOSIS — F411 Generalized anxiety disorder: Secondary | ICD-10-CM | POA: Diagnosis not present

## 2023-03-23 DIAGNOSIS — F3181 Bipolar II disorder: Secondary | ICD-10-CM | POA: Diagnosis not present

## 2023-04-06 DIAGNOSIS — M545 Low back pain, unspecified: Secondary | ICD-10-CM | POA: Diagnosis not present

## 2023-04-13 DIAGNOSIS — F3181 Bipolar II disorder: Secondary | ICD-10-CM | POA: Diagnosis not present

## 2023-04-13 DIAGNOSIS — F4310 Post-traumatic stress disorder, unspecified: Secondary | ICD-10-CM | POA: Diagnosis not present

## 2023-04-13 DIAGNOSIS — F411 Generalized anxiety disorder: Secondary | ICD-10-CM | POA: Diagnosis not present

## 2023-04-16 DIAGNOSIS — J069 Acute upper respiratory infection, unspecified: Secondary | ICD-10-CM | POA: Diagnosis not present

## 2023-04-16 DIAGNOSIS — Z20822 Contact with and (suspected) exposure to covid-19: Secondary | ICD-10-CM | POA: Diagnosis not present

## 2023-04-17 DIAGNOSIS — E162 Hypoglycemia, unspecified: Secondary | ICD-10-CM | POA: Diagnosis not present

## 2023-04-17 DIAGNOSIS — R634 Abnormal weight loss: Secondary | ICD-10-CM | POA: Diagnosis not present

## 2023-04-19 DIAGNOSIS — M5416 Radiculopathy, lumbar region: Secondary | ICD-10-CM | POA: Diagnosis not present

## 2023-05-04 DIAGNOSIS — F411 Generalized anxiety disorder: Secondary | ICD-10-CM | POA: Diagnosis not present

## 2023-05-04 DIAGNOSIS — F3181 Bipolar II disorder: Secondary | ICD-10-CM | POA: Diagnosis not present

## 2023-05-04 DIAGNOSIS — F4310 Post-traumatic stress disorder, unspecified: Secondary | ICD-10-CM | POA: Diagnosis not present

## 2023-05-11 DIAGNOSIS — E119 Type 2 diabetes mellitus without complications: Secondary | ICD-10-CM | POA: Diagnosis not present

## 2023-05-12 DIAGNOSIS — M5432 Sciatica, left side: Secondary | ICD-10-CM | POA: Diagnosis not present

## 2023-05-18 DIAGNOSIS — M5459 Other low back pain: Secondary | ICD-10-CM | POA: Diagnosis not present

## 2023-05-22 DIAGNOSIS — F3181 Bipolar II disorder: Secondary | ICD-10-CM | POA: Diagnosis not present

## 2023-05-22 DIAGNOSIS — F4310 Post-traumatic stress disorder, unspecified: Secondary | ICD-10-CM | POA: Diagnosis not present

## 2023-05-22 DIAGNOSIS — F411 Generalized anxiety disorder: Secondary | ICD-10-CM | POA: Diagnosis not present

## 2023-05-25 DIAGNOSIS — E162 Hypoglycemia, unspecified: Secondary | ICD-10-CM | POA: Diagnosis not present

## 2023-05-25 DIAGNOSIS — R7303 Prediabetes: Secondary | ICD-10-CM | POA: Diagnosis not present

## 2023-06-05 DIAGNOSIS — M5459 Other low back pain: Secondary | ICD-10-CM | POA: Diagnosis not present

## 2023-06-15 DIAGNOSIS — F4310 Post-traumatic stress disorder, unspecified: Secondary | ICD-10-CM | POA: Diagnosis not present

## 2023-06-15 DIAGNOSIS — F3181 Bipolar II disorder: Secondary | ICD-10-CM | POA: Diagnosis not present

## 2023-06-15 DIAGNOSIS — F411 Generalized anxiety disorder: Secondary | ICD-10-CM | POA: Diagnosis not present

## 2023-06-19 DIAGNOSIS — M5459 Other low back pain: Secondary | ICD-10-CM | POA: Diagnosis not present

## 2023-06-19 DIAGNOSIS — M545 Low back pain, unspecified: Secondary | ICD-10-CM | POA: Diagnosis not present

## 2024-03-24 ENCOUNTER — Other Ambulatory Visit (HOSPITAL_COMMUNITY)
Admission: EM | Admit: 2024-03-24 | Discharge: 2024-03-26 | Disposition: A | Discharge status: Home / Self Care | Source: Intra-hospital

## 2024-03-24 ENCOUNTER — Other Ambulatory Visit (HOSPITAL_COMMUNITY)
Admission: EM | Admit: 2024-03-24 | Discharge: 2024-03-24 | Disposition: A | Discharge status: Other Acute Inpatient Hospital | Attending: Psychiatry | Admitting: Psychiatry

## 2024-03-24 DIAGNOSIS — F17210 Nicotine dependence, cigarettes, uncomplicated: Secondary | ICD-10-CM | POA: Insufficient documentation

## 2024-03-24 DIAGNOSIS — F1090 Alcohol use, unspecified, uncomplicated: Secondary | ICD-10-CM

## 2024-03-24 DIAGNOSIS — R45851 Suicidal ideations: Secondary | ICD-10-CM | POA: Insufficient documentation

## 2024-03-24 DIAGNOSIS — F411 Generalized anxiety disorder: Secondary | ICD-10-CM | POA: Diagnosis not present

## 2024-03-24 DIAGNOSIS — F314 Bipolar disorder, current episode depressed, severe, without psychotic features: Secondary | ICD-10-CM | POA: Diagnosis not present

## 2024-03-24 DIAGNOSIS — F1010 Alcohol abuse, uncomplicated: Secondary | ICD-10-CM | POA: Diagnosis not present

## 2024-03-24 DIAGNOSIS — F3181 Bipolar II disorder: Secondary | ICD-10-CM | POA: Diagnosis not present

## 2024-03-24 LAB — URINALYSIS, ROUTINE W REFLEX MICROSCOPIC
Bilirubin Urine: NEGATIVE
Glucose, UA: NEGATIVE mg/dL
Ketones, ur: 20 mg/dL — AB
Leukocytes,Ua: NEGATIVE
Nitrite: NEGATIVE
Protein, ur: 30 mg/dL — AB
Specific Gravity, Urine: 1.024 (ref 1.005–1.030)
pH: 5 (ref 5.0–8.0)

## 2024-03-24 LAB — LIPID PANEL
Cholesterol: 190 mg/dL (ref 0–200)
HDL: >135 mg/dL
Triglycerides: 45 mg/dL
VLDL: 9 mg/dL (ref 0–40)

## 2024-03-24 LAB — COMPREHENSIVE METABOLIC PANEL WITH GFR
ALT: 26 U/L (ref 0–44)
AST: 34 U/L (ref 15–41)
Albumin: 4.9 g/dL (ref 3.5–5.0)
Alkaline Phosphatase: 64 U/L (ref 38–126)
Anion gap: 20 — ABNORMAL HIGH (ref 5–15)
BUN: 8 mg/dL (ref 6–20)
CO2: 24 mmol/L (ref 22–32)
Calcium: 9.5 mg/dL (ref 8.9–10.3)
Chloride: 99 mmol/L (ref 98–111)
Creatinine, Ser: 0.63 mg/dL (ref 0.44–1.00)
GFR, Estimated: >60 mL/min
Glucose, Bld: 97 mg/dL (ref 70–99)
Potassium: 4.4 mmol/L (ref 3.5–5.1)
Sodium: 142 mmol/L (ref 135–145)
Total Bilirubin: 0.7 mg/dL (ref 0.0–1.2)
Total Protein: 7.4 g/dL (ref 6.5–8.1)

## 2024-03-24 LAB — MAGNESIUM: Magnesium: 1.8 mg/dL (ref 1.7–2.4)

## 2024-03-24 LAB — GLUCOSE, CAPILLARY
Glucose-Capillary: 103 mg/dL — ABNORMAL HIGH (ref 70–99)
Glucose-Capillary: 155 mg/dL — ABNORMAL HIGH (ref 70–99)

## 2024-03-24 LAB — TSH: TSH: 0.299 u[IU]/mL — ABNORMAL LOW (ref 0.350–4.500)

## 2024-03-24 LAB — CBC WITH DIFFERENTIAL/PLATELET
Abs Immature Granulocytes: 0.08 K/uL — ABNORMAL HIGH (ref 0.00–0.07)
Basophils Absolute: 0.1 K/uL (ref 0.0–0.1)
Basophils Relative: 1 %
Eosinophils Absolute: 0 K/uL (ref 0.0–0.5)
Eosinophils Relative: 0 %
HCT: 41.9 % (ref 36.0–46.0)
Hemoglobin: 14.4 g/dL (ref 12.0–15.0)
Immature Granulocytes: 1 %
Lymphocytes Relative: 11 %
Lymphs Abs: 1.5 K/uL (ref 0.7–4.0)
MCH: 32.5 pg (ref 26.0–34.0)
MCHC: 34.4 g/dL (ref 30.0–36.0)
MCV: 94.6 fL (ref 80.0–100.0)
Monocytes Absolute: 0.5 K/uL (ref 0.1–1.0)
Monocytes Relative: 4 %
Neutro Abs: 11.4 K/uL — ABNORMAL HIGH (ref 1.7–7.7)
Neutrophils Relative %: 83 %
Platelets: 264 K/uL (ref 150–400)
RBC: 4.43 MIL/uL (ref 3.87–5.11)
RDW: 14 % (ref 11.5–15.5)
WBC: 13.6 K/uL — ABNORMAL HIGH (ref 4.0–10.5)
nRBC: 0 % (ref 0.0–0.2)

## 2024-03-24 LAB — POCT URINE DRUG SCREEN - MANUAL ENTRY (I-SCREEN)
POC Amphetamine UR: POSITIVE — AB
POC Buprenorphine (BUP): NOT DETECTED
POC Cocaine UR: NOT DETECTED
POC Marijuana UR: POSITIVE — AB
POC Methadone UR: NOT DETECTED
POC Methamphetamine UR: NOT DETECTED
POC Morphine: NOT DETECTED
POC Oxazepam (BZO): POSITIVE — AB
POC Oxycodone UR: NOT DETECTED
POC Secobarbital (BAR): NOT DETECTED

## 2024-03-24 LAB — ETHANOL: Alcohol, Ethyl (B): 58 mg/dL — ABNORMAL HIGH

## 2024-03-24 LAB — HEMOGLOBIN A1C
Hgb A1c MFr Bld: 5.4 % (ref 4.8–5.6)
Mean Plasma Glucose: 108.28 mg/dL

## 2024-03-24 LAB — POC URINE PREG, ED: Preg Test, Ur: NEGATIVE

## 2024-03-24 LAB — VITAMIN B12: Vitamin B-12: 1047 pg/mL — ABNORMAL HIGH (ref 180–914)

## 2024-03-24 LAB — POCT PREGNANCY, URINE: Preg Test, Ur: NEGATIVE

## 2024-03-24 LAB — VITAMIN D 25 HYDROXY (VIT D DEFICIENCY, FRACTURES): Vit D, 25-Hydroxy: 38.6 ng/mL (ref 30–100)

## 2024-03-24 MED ORDER — LORAZEPAM 2 MG/ML IJ SOLN: 2.0000 mg | Freq: Three times a day (TID) | INTRAMUSCULAR | Status: DC | PRN

## 2024-03-24 MED ORDER — HALOPERIDOL 5 MG PO TABS: 5.0000 mg | ORAL_TABLET | Freq: Three times a day (TID) | ORAL | Status: DC | PRN

## 2024-03-24 MED ORDER — DIPHENHYDRAMINE HCL 50 MG PO CAPS: 50.0000 mg | ORAL_CAPSULE | Freq: Three times a day (TID) | ORAL | Status: DC | PRN

## 2024-03-24 MED ORDER — NICOTINE 21 MG/24HR TD PT24
21.0000 mg | MEDICATED_PATCH | Freq: Every day | TRANSDERMAL | Status: DC
  Administered 2024-03-25: 21 mg via TRANSDERMAL
  Filled 2024-03-24 (×2): qty 1

## 2024-03-24 MED ORDER — ONDANSETRON 4 MG PO TBDP
4.0000 mg | ORAL_TABLET | Freq: Four times a day (QID) | ORAL | Status: DC | PRN
  Administered 2024-03-24: 4 mg via ORAL
  Filled 2024-03-24: qty 1

## 2024-03-24 MED ORDER — LORAZEPAM 1 MG PO TABS
1.0000 mg | ORAL_TABLET | Freq: Four times a day (QID) | ORAL | Status: DC
  Administered 2024-03-24 – 2024-03-25 (×4): 1 mg via ORAL
  Filled 2024-03-24 (×4): qty 1

## 2024-03-24 MED ORDER — HALOPERIDOL LACTATE 5 MG/ML IJ SOLN: 5.0000 mg | Freq: Three times a day (TID) | INTRAMUSCULAR | Status: DC | PRN

## 2024-03-24 MED ORDER — ACETAMINOPHEN 325 MG PO TABS
650.0000 mg | ORAL_TABLET | Freq: Four times a day (QID) | ORAL | Status: DC | PRN
  Filled 2024-03-24 (×2): qty 2

## 2024-03-24 MED ORDER — TRAZODONE HCL 50 MG PO TABS
50.0000 mg | ORAL_TABLET | Freq: Every evening | ORAL | Status: DC | PRN
  Administered 2024-03-24: 50 mg via ORAL
  Filled 2024-03-24 (×2): qty 1

## 2024-03-24 MED ORDER — LOPERAMIDE HCL 2 MG PO CAPS: 2.0000 mg | ORAL_CAPSULE | ORAL | Status: DC | PRN

## 2024-03-24 MED ORDER — ALUM & MAG HYDROXIDE-SIMETH 200-200-20 MG/5ML PO SUSP: 30.0000 mL | ORAL | Status: DC | PRN

## 2024-03-24 MED ORDER — HALOPERIDOL LACTATE 5 MG/ML IJ SOLN: 10.0000 mg | Freq: Three times a day (TID) | INTRAMUSCULAR | Status: DC | PRN

## 2024-03-24 MED ORDER — DIPHENHYDRAMINE HCL 50 MG/ML IJ SOLN: 50.0000 mg | Freq: Three times a day (TID) | INTRAMUSCULAR | Status: DC | PRN

## 2024-03-24 MED ORDER — ADULT MULTIVITAMIN W/MINERALS CH
1.0000 | ORAL_TABLET | Freq: Every day | ORAL | Status: DC
  Administered 2024-03-24 – 2024-03-26 (×3): 1 via ORAL
  Filled 2024-03-24 (×3): qty 1

## 2024-03-24 MED ORDER — ACETAMINOPHEN 325 MG PO TABS: 650.0000 mg | ORAL_TABLET | Freq: Four times a day (QID) | ORAL | Status: DC | PRN

## 2024-03-24 MED ORDER — HYDROXYZINE HCL 25 MG PO TABS: 25.0000 mg | ORAL_TABLET | Freq: Three times a day (TID) | ORAL | Status: DC | PRN

## 2024-03-24 MED ORDER — METFORMIN HCL 500 MG PO TABS
500.0000 mg | ORAL_TABLET | Freq: Every day | ORAL | Status: DC
  Administered 2024-03-25 – 2024-03-26 (×2): 500 mg via ORAL
  Filled 2024-03-24 (×2): qty 1

## 2024-03-24 MED ORDER — MAGNESIUM HYDROXIDE 400 MG/5ML PO SUSP: 30.0000 mL | Freq: Every day | ORAL | Status: DC | PRN

## 2024-03-24 MED ORDER — NICOTINE 21 MG/24HR TD PT24: 21.0000 mg | MEDICATED_PATCH | Freq: Every day | TRANSDERMAL | Status: DC

## 2024-03-24 MED ORDER — TRAZODONE HCL 50 MG PO TABS: 50.0000 mg | ORAL_TABLET | Freq: Every evening | ORAL | Status: DC | PRN

## 2024-03-24 MED ORDER — THIAMINE MONONITRATE 100 MG PO TABS
100.0000 mg | ORAL_TABLET | Freq: Every day | ORAL | Status: DC
  Administered 2024-03-25 – 2024-03-26 (×2): 100 mg via ORAL
  Filled 2024-03-24 (×2): qty 1

## 2024-03-24 MED ORDER — HYDROXYZINE HCL 25 MG PO TABS
25.0000 mg | ORAL_TABLET | Freq: Four times a day (QID) | ORAL | Status: DC | PRN
  Administered 2024-03-24 – 2024-03-26 (×5): 25 mg via ORAL
  Filled 2024-03-24 (×5): qty 1

## 2024-03-24 MED ORDER — LORAZEPAM 1 MG PO TABS: 1.0000 mg | ORAL_TABLET | Freq: Two times a day (BID) | ORAL | Status: DC

## 2024-03-24 MED ORDER — LORAZEPAM 1 MG PO TABS: 1.0000 mg | ORAL_TABLET | Freq: Every day | ORAL | Status: DC

## 2024-03-24 MED ORDER — THIAMINE HCL 100 MG/ML IJ SOLN
100.0000 mg | Freq: Once | INTRAMUSCULAR | Status: AC
  Administered 2024-03-24: 100 mg via INTRAMUSCULAR
  Filled 2024-03-24: qty 2

## 2024-03-24 MED ORDER — LORAZEPAM 1 MG PO TABS: 1.0000 mg | ORAL_TABLET | Freq: Three times a day (TID) | ORAL | Status: DC

## 2024-03-24 MED ORDER — LORAZEPAM 1 MG PO TABS
1.0000 mg | ORAL_TABLET | Freq: Four times a day (QID) | ORAL | Status: DC | PRN
  Administered 2024-03-25 (×2): 1 mg via ORAL
  Filled 2024-03-24: qty 1
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
## 2024-03-24 NOTE — ED Notes (Signed)
 Medications reviewed, questions denied. Pt administered prn zofran  for c/o nausea.

## 2024-03-24 NOTE — Progress Notes (Signed)
 [facility] urgent CARE eNCOUnter  Educational Psychologist PA-C -------------------------------------------------------------------------------------------------------------------  FINAL IMPRESSIONS    Provider Remarks:  Assessment/Plan: Diagnoses and all orders for this visit:  Throat pain -     POCT Rapid Strep A Screen - RN Obtain  Cough, unspecified type -     POCT SARS/FLU/RSV - RN OBTAIN  History of depression  Other orders -     gabapentin  10 % cmap -     albuterol  HFA 90 mcg/actuation inhaler; Inhale 2 puffs every six (6) hours as needed. -     FREESTYLE LIBRE 3 PLUS SENSOR Devi; Place new sensor every 15 days for 30 days -     clobetasol (CLOBEX) 0.05 % shampoo; Apply topically daily. -     doxepin  (SINEQUAN ) 10 MG capsule; Take one capsule by mouth at bedtime for sleep -     glucagon (GVOKE HYPOPEN 1-PACK) 1 mg/0.2 mL AtIn; as directed Subcutaneous Once a day PRN -     lamoTRIgine  (LAMICTAL ) 200 MG tablet; Take 2 tablets (400 mg total) by mouth at bedtime. -     LORazepam  (ATIVAN ) 1 MG tablet; Take 1/2- 1 tablet by mouth daily as needed for severe anxiety/panic -     metFORMIN  (GLUCOPHAGE -XR) 500 MG 24 hr tablet; Take 1 tablet (500 mg total) by mouth every evening. -     POCT SARS/Flu/RSV -     POCT Rapid Strep A  39 year old female in nonacute distress past medical history of pancreatitis, alcohol induced acute pancreatitis, hepatic fibrosis, tobacco abuse, history of lactic acidosis, dehydration, dizziness, syncope presents today with 2-week history of cough that is productive bringing, fatigue, and nausea.  On physical exam tympanic membranes have effusions bilateral, pharynx is erythematous patent airway, CTAB, tachycardic, positive bowel sounds.  COVID/flu/RSV were all negative, PCR strep test is pending.  Mother informs me patient is suicidal and feels like she does not belong, history of depression.  Mother states they will be going to [facility] for mental  health treatment patient is going voluntarily.  Mother states she is going through a difficult relationship with significant other. I will call her with her test results of her strep.  12:21 PM called patient's phone number got voicemail however her voicemail was full and I could not leave a message.  PCR strep test was negative.  Urgent Care Labs Ordered:  Results for orders placed or performed in visit on 03/24/24  POCT SARS/Flu/RSV  Result Value Ref Range   POCT SARS-CoV-2 NAA Negative Negative   Influenza A Negative Negative   Influenza B Negative Negative   POC Rapid RSV, NAA Negative Negative  POCT Rapid Strep A  Result Value Ref Range   Rapid Strep A Screen Negative Negative   _____________________________________________________________________  CHIEF COMPLAINT   Chief Complaint  Patient presents with   Cough    Productive X 2 weeks with green sputum   Fatigue    X 2 weeks   Nausea    X 2 weeks    Patient information was obtained from: patient. History/Exam limitations: none. Patient arrived by private vehicle and walked into facility.  HISTORY OF PRESENT ILLNESS:  Alison Koch is a 39 y.o. female who presents today for evaluation of: Presents with mother today 2-week history of productive cough that is green, fatigue, nausea.  Low-grade fever in clinic at 99.5 F.  Mother informs me that patient has decided to do voluntary commitment.  They will be going to [facility].  Patient  has been drinking alcohol and feels like she she is suicidal, and depressed.  Mother states having relationship problems.   REVIEW OF SYSTEMS Review of  systems negative unless noted in HPI.  Records Review:  1.  Old medical records, current nursing notes, and previous provider notes have been read, reviewed and appreciated if available and when applicable to today's visit. 2.  Chart review included allergies, current medications, past family history, past medical history,  past social history, past surgical history and problem list.  PAST MEDICAL HISTORY   Past Medical History[1]  SURGICAL HISTORY   Past Surgical History[2]  IMMUNIZATION HISTORY Immunization History  Administered Date(s) Administered   COVID-19 VACCINE,MRNA(MODERNA)(PF) 06/19/2019, 07/17/2019   HPV Quadrivalent (Gardasil) 06/05/2005   Hep A / Hep B 01/06/2019, 08/21/2019   Influenza Vaccine Quad(IM)6 MO-Adult(PF) 04/10/2016   Influenza Virus Vaccine, unspecified formulation 04/10/2016, 10/30/2018, 08/25/2019, 02/17/2020   Meningococcal Conjugate MCV4P 02/13/2005   PNEUMOCOCCAL POLYSACCHARIDE 23-VALENT 01/21/2019   TdaP 02/13/2005, 07/08/2017     CURRENT MEDICATIONS   Current Medications[3]  ALLERGIES   Allergies[4]  FAMILY HISTORY   Family History[5]     Tobacco Use: High Risk (03/24/2024)   Patient History    Smoking Tobacco Use: Every Day    Smokeless Tobacco Use: Never    Passive Exposure: Current      PHYSICAL EXAM   Vital Signs:  BP 116/78   Koch 114   Temp 37.5 C (99.5 F) (Oral)   Resp 20   Ht 165.1 cm (5' 5)   Wt 59 kg (130 lb)   LMP 03/03/2024 (Approximate)   SpO2 96%   BMI 21.63 kg/m   Body mass index is 21.63 kg/m. Patient's last menstrual period was 03/03/2024 (approximate).  Physical Examination:  General appearance: alert and oriented, no emotional distress, calm and cooperative, and appears stated age. Head: normocephalic and without obvious abnormality. Eyes: lids and lashes normal, conjunctivae and sclerae normal, corneas clear and pupils equal, round, reactive to light and accomodation. Ears: tympanic membranes have effusions bilaterally, non erythematous. Nose: nares normal and no discharge. Throat: throat is  erythematous, no tonsillar edema or exudate noted. Neck: supple, symmetrical, and trachea midline. Lungs: generally clear to auscultation bilaterally with unlabored breathing, no use of accessory muscles for  respiration, no tachypnea or dyspnea.  No cough noted in room. Heart: Tachycardia,; S1, S2 normal. Abdomen: soft, non-tender, non-distended, and bowel sounds normoactive. Extremities: extremities normal, atraumatic, and no cyanosis observed. Neurologic: mental status appropriate for current medical condition; speech normal and unpressured. Mental Status: alert and oriented with appropriate appearance, behavior, and attitude. Skin: normothermic, no diaphoresis, obvious rash, pallor, cyanosis, or flushing.    URGENT CARE MEDICAL DECISION MAKING & CLINIC COURSE  Urgent Care Imaging: No image results found.    Urgent Care Labs Ordered:  Results for orders placed or performed in visit on 03/24/24  POCT SARS/Flu/RSV  Result Value Ref Range   POCT SARS-CoV-2 NAA Negative Negative   Influenza A Negative Negative   Influenza B Negative Negative   POC Rapid RSV, NAA Negative Negative  POCT Rapid Strep A  Result Value Ref Range   Rapid Strep A Screen Negative Negative    Urgent Care Clinic Treatments: No treatments in office clinically appropriate and/or necessary at this time.  Requested Consultations: None   Urgent Care Clinic Plan: As outlined above in the clinical record.  DISPOSITION  Transferred to ED Patient Disposition  Indication for Disposition: Requires a higher level of care  Chief  Complaint: Suicidal, depression presents with mother   Kathrina Crosley given instructions to proceed to ED via personal vehicle; Location: [facility] due to Requires a higher level of care, testing, and/or treatment not able to be provided at Urgent Care   Report called to: None      Disposition Upon Discharge:  1.  Routine symptom specific, illness specific and/or disease specific instructions were discussed with the patient and/or caregiver at length.  2.  The differential diagnosis for the patient's specific symptoms were reviewed and discussed with the patient/caregiver at  length; the patient/caregiver expressed understanding of all discussed and their relevant questions were all satisfactorily answered.  3.  Return to care should the presenting symptoms recur, persist, or worsen in any way; or in the alternative, if new symptoms or complaints develop.  4.  The patient and any family present were given verbal and/or written discharge instructions as clinically indicated and appropriate.  5.  Continue OTC medications as needed and tolerated for control of the currently reported symptoms, if no conflict with the currently prescribed medications  -------------------------------------------------------------------------------------------------------------------------------------------- Note - This record has been created using [employer]. Chart creation errors have been sought but may not always have been located. Such creation errors do not reflect on the standard of medical care.       [1] Past Medical History: Diagnosis Date   Alcohol abuse    history of, now only drinks occasionally   Alcoholic pancreatitis ([facility])    Encephalitis ([facility]) 1998   Lupus    Pancreatic pseudocyst ([facility])    Tobacco abuse   [2] Past Surgical History: Procedure Laterality Date   WISDOM TOOTH EXTRACTION    [3]  Current Outpatient Medications:    albuterol  HFA 90 mcg/actuation inhaler, Inhale 2 puffs every six (6) hours as needed., Disp: , Rfl:    clobetasol (CLOBEX) 0.05 % shampoo, Apply topically daily., Disp: , Rfl:    FREESTYLE LIBRE 3 PLUS SENSOR Devi, Place new sensor every 15 days for 30 days, Disp: , Rfl:    glucagon (GVOKE HYPOPEN 1-PACK) 1 mg/0.2 mL AtIn, as directed Subcutaneous Once a day PRN, Disp: , Rfl:    doxepin  (SINEQUAN ) 10 MG capsule, Take one capsule by mouth at bedtime for sleep, Disp: , Rfl:    gabapentin  (NEURONTIN ) 600 MG tablet, Take 1 tablet (600 mg total) by mouth two (2) times a day., Disp: , Rfl:    gabapentin  10 %  cmap, , Disp: , Rfl:    lamoTRIgine  (LAMICTAL ) 200 MG tablet, Take 2 tablets (400 mg total) by mouth at bedtime., Disp: , Rfl:    LORazepam  (ATIVAN ) 1 MG tablet, Take 1/2- 1 tablet by mouth daily as needed for severe anxiety/panic, Disp: , Rfl:    lurasidone  60 mg Tab, TAKE ONE TABLET BY MOUTH EVERY EVENING WITH DINNER, Disp: , Rfl:    metFORMIN  (GLUCOPHAGE -XR) 500 MG 24 hr tablet, Take 1 tablet (500 mg total) by mouth every evening., Disp: , Rfl:  [4] Allergies Allergen Reactions   Buspirone Swelling, Hives, Itching, Muscle Pain and Rash    Swelling of muscles  Other Reaction(s): Myalgia / Swelling  Swelling of muscles  Swelling of muscles  Swelling of muscles Swelling of muscles    Swelling of muscles   Codeine Other (See Comments), Rash and Dizziness    Dizziness, syncope  Dizziness, syncope    Dizziness, syncope Dizziness, syncope    Dizziness, syncope  Dizziness, syncope Dizziness, syncope Dizziness, syncope  Dizziness, syncope  Dizziness, syncope  Dizziness, syncope  Dizziness, syncope   Codeine-Guaifenesin Other (See Comments) and Dizziness    Dizziness, syncope  Dizziness, syncope  Dizziness, syncope  Dizziness, syncope, Dizziness, syncope   Vancomycin Analogues Itching, Other (See Comments), Hives, Rash and Swelling   Tramadol Nausea Only  [5] Family History Problem Relation Age of Onset   Diabetes Mother
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
## 2024-03-24 NOTE — ED Notes (Signed)
 Pt oriented to unit, questions denied. Pt provided with green beans as requested. Pt currently on phone, no distress observed. Pt denies active SI, saying that she feels she just doesn't want to live. Pt denies plan or intent to harm self, verbal contract for safety provided, pt denies hi and avh. Pt reports hx of falls r/t drinking alcohol.

## 2024-03-24 NOTE — Group Note (Signed)
 Group Topic: Healthy Self Image and Positive Change  Group Date: 03/24/2024 Start Time: 2030 End Time: 2100 Facilitators: Anice Benton LABOR, NT  Department: [facility]  Number of Participants: 6  Group Focus: feeling awareness/expression and goals/reality orientation Treatment Modality:  Patient-Centered Therapy and Solution-Focused Therapy Interventions utilized were assignment Purpose: relapse prevention strategies  Name: Alison Koch Date of Birth: [birth year]  MR: [id number]    Level of Participation: Did not attend Quality of Participation: N/A Interactions with others: N/A Mood/Affect: N/A Triggers (if applicable): N/A Cognition: N/A Progress: None Response: N/A Plan: patient will be encouraged to attend groups  Patients Problems:  Patient Active Problem List   Diagnosis Date Noted   Bipolar 2 disorder, major depressive episode (HCC) 03/24/2024   Severe recurrent major depression with psychotic features (HCC) 01/20/2019   Alcohol dependence with alcohol-induced mood disorder (HCC)

## 2024-03-24 NOTE — ED Notes (Signed)
 Pt received at the beginning of the shift bed resting without complaints.  Appropriate to verbal  response. Denies thoughts of self harm.  Safety maintained.
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
## 2024-03-24 NOTE — ED Notes (Signed)
 Pt admitted to [facility].  Admission work up initiated as blood has been drawn, EKG completed, UDS collected.  Pt oriented to environment. VSS.  Pt reports having relationship problems which have led to her being depressed, having suicidal thoughts and relapsing on alcohol.

## 2024-03-25 DIAGNOSIS — F3181 Bipolar II disorder: Secondary | ICD-10-CM

## 2024-03-25 LAB — GLUCOSE, CAPILLARY
Glucose-Capillary: 92 mg/dL (ref 70–99)
Glucose-Capillary: 126 mg/dL — ABNORMAL HIGH (ref 70–99)
Glucose-Capillary: 108 mg/dL — ABNORMAL HIGH (ref 70–99)

## 2024-03-25 MED ORDER — DOXEPIN HCL 10 MG PO CAPS
ORAL_CAPSULE | ORAL | Status: AC
  Administered 2024-03-25: 10 mg via ORAL
  Filled 2024-03-25: qty 1

## 2024-03-25 MED ORDER — LURASIDONE HCL 60 MG PO TABS
60.0000 mg | ORAL_TABLET | Freq: Every day | ORAL | Status: DC
  Administered 2024-03-25: 60 mg via ORAL

## 2024-03-25 MED ORDER — TRAZODONE HCL 50 MG PO TABS
50.0000 mg | ORAL_TABLET | Freq: Every evening | ORAL | Status: DC | PRN
  Filled 2024-03-25: qty 1

## 2024-03-25 MED ORDER — LAMOTRIGINE 150 MG PO TABS
400.0000 mg | ORAL_TABLET | Freq: Every day | ORAL | Status: DC
  Administered 2024-03-25: 400 mg via ORAL
  Filled 2024-03-25: qty 1

## 2024-03-25 MED ORDER — LURASIDONE HCL 60 MG PO TABS
ORAL_TABLET | ORAL | Status: AC
  Administered 2024-03-25: 60 mg via ORAL
  Filled 2024-03-25: qty 1

## 2024-03-25 MED ORDER — DOXEPIN HCL 10 MG PO CAPS
10.0000 mg | ORAL_CAPSULE | Freq: Every day | ORAL | Status: DC
  Administered 2024-03-25: 10 mg via ORAL

## 2024-03-25 NOTE — ED Notes (Signed)
 Patient A&Ox4. Denies intent to harm self/others when asked. Denies A/VH. Patient denies any physical complaints when asked. Pt reports, I feel depressed because I really messed up with my boyfriend and I don't know what's going to happen to us  when I get out of here. We live together but he don't want to talk to me right now. Support and encouragement provided. Routine safety checks conducted according to facility protocol. Encouraged patient to notify staff if thoughts of harm toward self or others arise. Patient verbalize understanding and agreement. Will continue to monitor for safety.

## 2024-03-25 NOTE — Group Note (Signed)
 Group Topic: Overcoming Obstacles  Group Date: 03/25/2024 Start Time: 1100 End Time: 1200 Facilitators: Deidre Prentis CROME, NT  Department: [facility]  Number of Participants: 5  Group Focus: community group Treatment Modality:  Psychoeducation Interventions utilized were patient education Purpose: increase insight  Name: Alison Koch Date of Birth: September 25, 1985  MR: [id number]    Level of Participation: minimal Quality of Participation: cooperative Interactions with others: gave feedback Mood/Affect: appropriate Triggers (if applicable): N/A Cognition: coherent/clear Progress: Gaining insight Response: Pt appeared to understand content discussed during Chaplain-led group. Plan: follow-up needed  Patients Problems:  Patient Active Problem List   Diagnosis Date Noted   Bipolar 2 disorder, major depressive episode (HCC) 03/24/2024   Severe recurrent major depression with psychotic features (HCC) 01/20/2019   Alcohol dependence with alcohol-induced mood disorder (HCC)

## 2024-03-25 NOTE — Care Plan (Signed)
 Interdisciplinary Treatment and Diagnostic Plan Update  03/25/2024 Time of Session: 2pm Alison Koch MRN: [id number]  Diagnosis:  Final diagnoses:  Bipolar 2 disorder (HCC)  Alcohol use disorder     Current Medications:  Current Facility-Administered Medications  Medication Dose Route Frequency Provider Last Rate Last Admin   acetaminophen  (TYLENOL ) tablet 650 mg  650 mg Oral Q6H PRN Tex Drilling, NP       alum & mag hydroxide-simeth (MAALOX/MYLANTA) 200-200-20 MG/5ML suspension 30 mL  30 mL Oral Q4H PRN Nkwenti, Doris, NP       haloperidol  (HALDOL ) tablet 5 mg  5 mg Oral TID PRN Tex Drilling, NP       And   diphenhydrAMINE  (BENADRYL ) capsule 50 mg  50 mg Oral TID PRN Tex Drilling, NP       haloperidol  lactate (HALDOL ) injection 5 mg  5 mg Intramuscular TID PRN Tex Drilling, NP       And   diphenhydrAMINE  (BENADRYL ) injection 50 mg  50 mg Intramuscular TID PRN Tex Drilling, NP       And   LORazepam  (ATIVAN ) injection 2 mg  2 mg Intramuscular TID PRN Tex Drilling, NP       haloperidol  lactate (HALDOL ) injection 10 mg  10 mg Intramuscular TID PRN Tex Drilling, NP       And   diphenhydrAMINE  (BENADRYL ) injection 50 mg  50 mg Intramuscular TID PRN Tex Drilling, NP       And   LORazepam  (ATIVAN ) injection 2 mg  2 mg Intramuscular TID PRN Tex Drilling, NP       hydrOXYzine  (ATARAX ) tablet 25 mg  25 mg Oral Q6H PRN Tex Drilling, NP   25 mg at 03/25/24 0635   loperamide  (IMODIUM ) capsule 2-4 mg  2-4 mg Oral PRN Nkwenti, Doris, NP       LORazepam  (ATIVAN ) tablet 1 mg  1 mg Oral Q6H PRN Tex Drilling, NP   1 mg at 03/25/24 0635   magnesium  hydroxide (MILK OF MAGNESIA) suspension 30 mL  30 mL Oral Daily PRN Tex Drilling, NP       metFORMIN  (GLUCOPHAGE ) tablet 500 mg  500 mg Oral Q breakfast Tex Drilling, NP   500 mg at 03/25/24 0814   multivitamin with minerals tablet 1 tablet  1 tablet Oral Daily Tex Drilling, NP   1 tablet at 03/25/24 0945   nicotine  (NICODERM  CQ - dosed in mg/24 hours) patch 21 mg  21 mg Transdermal Q0600 Nkwenti, Doris, NP   21 mg at 03/25/24 9366   ondansetron  (ZOFRAN -ODT) disintegrating tablet 4 mg  4 mg Oral Q6H PRN Tex Drilling, NP   4 mg at 03/24/24 1839   thiamine  (VITAMIN B1) tablet 100 mg  100 mg Oral Daily Tex Drilling, NP   100 mg at 03/25/24 0945   traZODone  (DESYREL ) tablet 50 mg  50 mg Oral QHS PRN Tex Drilling, NP   50 mg at 03/24/24 2150   Current Outpatient Medications  Medication Sig Dispense Refill   albuterol  (VENTOLIN  HFA) 108 (90 Base) MCG/ACT inhaler Inhale 2 puffs into the lungs every 6 (six) hours as needed for wheezing or shortness of breath.      Clobetasol Propionate 0.05 % shampoo Apply 1 Application topically once a week.     doxepin  (SINEQUAN ) 10 MG capsule Take 10 mg by mouth at bedtime.     gabapentin  (NEURONTIN ) 600 MG tablet Take 600 mg by mouth daily.     lamoTRIgine  (LAMICTAL ) 200 MG tablet  Take 400 mg by mouth at bedtime.     LORazepam  (ATIVAN ) 1 MG tablet Take 0.5-1 mg by mouth daily as needed (For anxiety or panic attacks).     Lurasidone  HCl 60 MG TABS Take 60 mg by mouth at bedtime.     metFORMIN  (GLUCOPHAGE -XR) 500 MG 24 hr tablet Take 500 mg by mouth daily.     norethindrone (MICRONOR) 0.35 MG tablet Take 1 tablet by mouth at bedtime.     Vilazodone HCl 20 MG TABS Take 20 mg by mouth at bedtime.     PTA Medications: Prior to Admission medications  Medication Sig Start Date End Date Taking? Authorizing Provider  albuterol  (VENTOLIN  HFA) 108 (90 Base) MCG/ACT inhaler Inhale 2 puffs into the lungs every 6 (six) hours as needed for wheezing or shortness of breath.  08/26/18  Yes [provider]  Clobetasol Propionate 0.05 % shampoo Apply 1 Application topically once a week. 11/02/23  Yes [provider]  doxepin  (SINEQUAN ) 10 MG capsule Take 10 mg by mouth at bedtime.   Yes [provider]  gabapentin  (NEURONTIN ) 600 MG tablet Take 600 mg by mouth daily.   Yes  [provider]  lamoTRIgine  (LAMICTAL ) 200 MG tablet Take 400 mg by mouth at bedtime. 10/05/23  Yes [provider]  LORazepam  (ATIVAN ) 1 MG tablet Take 0.5-1 mg by mouth daily as needed (For anxiety or panic attacks). 08/24/22  Yes [provider]  Lurasidone  HCl 60 MG TABS Take 60 mg by mouth at bedtime.   Yes [provider]  metFORMIN  (GLUCOPHAGE -XR) 500 MG 24 hr tablet Take 500 mg by mouth daily.   Yes [provider]  norethindrone (MICRONOR) 0.35 MG tablet Take 1 tablet by mouth at bedtime. 10/05/23  Yes [provider]  Vilazodone HCl 20 MG TABS Take 20 mg by mouth at bedtime. 02/13/24  Yes [provider]    Patient Stressors: Substance abuse   Other: Arguing with boyfriend    Patient Strengths: Ability for insight  Motivation for treatment/growth  Supportive family/friends   Treatment Modalities: Medication Management, Group therapy, Case management,  1 to 1 session with clinician, Psychoeducation, Recreational therapy.   Physician Treatment Plan for Primary and Secondary Diagnosis:  Final diagnoses:  Bipolar 2 disorder (HCC)  Alcohol use disorder   Long Term Goal(s):  Abstain from substances and attend outpatient therapy as scheduled by provider.  Take medications as prescribed and inform provider of any substance use.  Attend NA and AA meetings to help sustain sobriety and gain a network of support.   Short Term Goals:    Plan Of Care/Follow-up recommendations:  The patient is able to verbalize their individual safety plan to this provider.   # It is recommended to the patient to follow up with your outpatient psychiatric provider and PCP.   # It was discussed with the patient, the impact of alcohol, drugs, tobacco have been there overall psychiatric and medical wellbeing, and total abstinence from substance use was recommended the patient. -Recommend abstinence from alcohol, tobacco, and other illicit drug use  at discharge.    # In the event of worsening symptoms, the patient is instructed to call the crisis hotline, 911 and or go to the nearest ED for appropriate evaluation and treatment of symptoms. To follow-up with primary care provider for other medical issues, concerns and or health care needs      -If your psychiatric symptoms recur, worsen, or if you have side effects to  your psychiatric medications, call your outpatient psychiatric provider, 911, [phone] or go to the nearest emergency department.      Medication Management: Evaluate patient's response, side effects, and tolerance of medication regimen.  Therapeutic Interventions: 1 to 1 sessions, Unit Group sessions and Medication administration.  Evaluation of Outcomes: Progressing  LCSW Treatment Plan for Primary Diagnosis:  Final diagnoses:  Bipolar 2 disorder (HCC)  Alcohol use disorder    Long Term Goal(s): Safe transition to appropriate next level of care at discharge.  Short Term Goals: Facilitate acceptance of mental health diagnosis and concerns through verbal commitment to aftercare plan and appointments at discharge.  Therapeutic Interventions: Assess for all discharge needs, 1 to 1 time with Child psychotherapist, Explore available resources and support systems, Assess for adequacy in community support network, Educate family and significant other(s) on suicide prevention, Complete Psychosocial Assessment, Interpersonal group therapy.  Evaluation of Outcomes: Progressing   Progress in Treatment: Attending groups: Yes. Participating in groups: Yes. Taking medication as prescribed: Yes. Toleration medication: Yes.  Client has been on a Ativan  taper.  Taper will be discontinued today. Family/Significant other contact made: Yes, individual(s) contacted:  Boyfriend Patient understands diagnosis: Yes. Discussing patient identified problems/goals with staff: Yes. Medical problems stabilized or resolved: Yes. Denies  suicidal/homicidal ideation: Yes. Issues/concerns per patient self-inventory: No. Other: Client reports no SI/HI and or plan.  Client reports no AVH.  Client reports she had a conversation with her boyfriend and he reports they will work it out and start over once she comes home.    Writer urged the client to focus on herself getting better and meeting with therapist and medication management appointments.  Client reports she will.    New problem(s) identified: No, Describe:  NA  New Short Term/Long Term Goal(s): Client will complete detox with three days.  Client will self report no SI/HI and or plans.  Client will refrain from substance use and use coping skills as needed.   Patient Goals:  restart journey of sobriety and utilize coping skills to avoid SI/HI and or plans.    Discharge Plan or Barriers: Client reports no barriers.  Client reports being hopeful/positive about her recovery.    Reason for Continuation of Hospitalization: Anxiety Depression Medication stabilization  Estimated Length of Stay: 03/24/24 -  03/27/24  Last 3 [place] Suicide Severity Risk Score: Flowsheet Row ED from 03/24/2024 in [facility] Most recent reading at 03/24/2024  5:59 PM ED from 03/24/2024 in [facility] Most recent reading at 03/24/2024  5:23 PM ED from 04/26/2021 in [facility] Department at [facility] Most recent reading at 04/26/2021  5:24 AM  C-SSRS RISK CATEGORY Moderate Risk Low Risk No Risk    Last PHQ 2/9 Scores:    03/24/2024    6:11 PM 03/24/2024    5:20 PM  Depression screen PHQ 2/9  Decreased Interest 2 3  Down, Depressed, Hopeless 2 3  PHQ - 2 Score 4 6  Altered sleeping 2 2  Tired, decreased energy 1 2  Change in appetite 1 2  Feeling bad or failure about yourself  2 2  Trouble concentrating 2 2  Moving slowly or fidgety/restless 2 2  Suicidal thoughts 2 2  PHQ-9 Score 16 20  Difficult doing  work/chores Somewhat difficult Very difficult    Scribe for Treatment Team: Rueben Kassim, LCSW 03/25/2024 4:31 PM

## 2024-03-25 NOTE — ED Notes (Signed)
 Pt presents with a flat affect, answers simple short questions asked appropriately, brief eye contact , pt denies distress or discomfort, other than wanting to get some sleep.  Denies plan to self harm or SI during tie of interview.  Safety maintained.

## 2024-03-25 NOTE — ED Provider Notes (Signed)
 Patient's RN came to provider with concern that her home medications were not restarted. She states she normally is on doxepin , lurasidone , vilazodone, and lamotrigine  and she took all these up until arrival at [facility]. Given last dose was reportedly 1/18, plan to restart lamotrigine , doxepin , and lurasidone . Will defer vilazodone to daytime [facility] provider.  Plan -Restart home lamotrigine  400 mg at bedtime -Restart home lurasidone  60 mg at bedtime (should be taken with food, but likely need to discuss this aspect with patient tomorrow) -Restart home doxepin  10 mg at bedtime  -holding vilazodone to discuss with daytime [facility] provider   -Prentice Espy, MD Psychiatry Resident, PGY4

## 2024-03-25 NOTE — ED Notes (Addendum)
Pt laying on bed resting in no acute distress. RR even and unlabored. Environment secured. Will continue to monitor for safety.

## 2024-03-25 NOTE — ED Provider Notes (Signed)
 Behavioral Health Progress Note  Date and Time: 03/25/2024 3:23 PM Name: Alison Koch MRN:  [id number]  Subjective:  Alison Koch is a 39 year old female who presented voluntarily to [facility] behavioral health for walk-in assessment on 03/24/2024.  Patient endorsed suicidal ideation upon initial assessment.  Recent stressors include relapse on alcohol and relationship issues with boyfriend.  Chart reviewed and patient discussed with attending psychiatrist, Dr. Lawrnce, on 03/25/2024.  Patient is reassessed by this nurse practitioner face-to-face.  Patient is seated, in no apparent distress.  She is alert and oriented, pleasant and cooperative during assessment.  Patient denies pain, denies physical complaint.  She presents with anxious mood, congruent affect.  Tory states I am feeling a lot better today.  I want to go home tomorrow because I need to get back to work.  Patient reports both sleep and appetite improving.  Describes her mood as getting better.  Patient denies SI/HI/AVH.  There is no evidence of delusional thought content no indication of patient is responding to internal stimuli.  Alieah reports relapsed on alcohol approximately 1 week ago after 2 years sobriety.  Patient declines residential substance use treatment options at this time.  Patient states I have the tools in my toolbox and the resources.  I know what I need to do and I plan to start attending meetings.  Patient is followed by outpatient psychiatry at [facility].  Appointment with medication management provider, Saddie Ha PA, scheduled for 04/01/2024 at 1:30 PM.  She meets with individual counseling provider, the visit every other week.  Patient is compliant with medications.  Patient offered support and encouragement.   We discussed continuing medications as ordered with some adjustments as follows: -Discontinue lorazepam  taper at this time.  CIWA measures 0 today.  Patient denies  symptoms of alcohol withdrawal.  Diagnosis:  Final diagnoses:  Bipolar 2 disorder (HCC)  Alcohol use disorder    Total Time spent with patient: 30 minutes  Past Psychiatric History: MDD, GAD Past Medical History:  Past Medical History:  Diagnosis Date   Lupus (HCC)     Family History: None reported Family Psychiatric  History: None reported Social History: Patient resides in [place] with her boyfriend.  She denies access to weapons.  She is employed in [employer].  Additional Social History:                         Sleep: Fair  Appetite:  Fair  Current Medications:  Current Facility-Administered Medications  Medication Dose Route Frequency Provider Last Rate Last Admin   acetaminophen  (TYLENOL ) tablet 650 mg  650 mg Oral Q6H PRN Nkwenti, Doris, NP       alum & mag hydroxide-simeth (MAALOX/MYLANTA) 200-200-20 MG/5ML suspension 30 mL  30 mL Oral Q4H PRN Nkwenti, Doris, NP       haloperidol  (HALDOL ) tablet 5 mg  5 mg Oral TID PRN Tex Drilling, NP       And   diphenhydrAMINE  (BENADRYL ) capsule 50 mg  50 mg Oral TID PRN Tex Drilling, NP       haloperidol  lactate (HALDOL ) injection 5 mg  5 mg Intramuscular TID PRN Tex Drilling, NP       And   diphenhydrAMINE  (BENADRYL ) injection 50 mg  50 mg Intramuscular TID PRN Tex Drilling, NP       And   LORazepam  (ATIVAN ) injection 2 mg  2 mg Intramuscular TID PRN Tex Drilling, NP  haloperidol  lactate (HALDOL ) injection 10 mg  10 mg Intramuscular TID PRN Tex Drilling, NP       And   diphenhydrAMINE  (BENADRYL ) injection 50 mg  50 mg Intramuscular TID PRN Tex Drilling, NP       And   LORazepam  (ATIVAN ) injection 2 mg  2 mg Intramuscular TID PRN Tex Drilling, NP       hydrOXYzine  (ATARAX ) tablet 25 mg  25 mg Oral Q6H PRN Tex Drilling, NP   25 mg at 03/25/24 0635   loperamide  (IMODIUM ) capsule 2-4 mg  2-4 mg Oral PRN Tex Drilling, NP       LORazepam  (ATIVAN ) tablet 1 mg  1 mg Oral Q6H PRN Tex Drilling, NP   1 mg at 03/25/24 9364   LORazepam  (ATIVAN ) tablet 1 mg  1 mg Oral QID Nkwenti, Doris, NP   1 mg at 03/25/24 1358   Followed by   NOREEN ON 03/26/2024] LORazepam  (ATIVAN ) tablet 1 mg  1 mg Oral TID Tex Drilling, NP       Followed by   NOREEN ON 03/27/2024] LORazepam  (ATIVAN ) tablet 1 mg  1 mg Oral BID Tex Drilling, NP       Followed by   NOREEN ON 03/28/2024] LORazepam  (ATIVAN ) tablet 1 mg  1 mg Oral Daily Nkwenti, Doris, NP       magnesium  hydroxide (MILK OF MAGNESIA) suspension 30 mL  30 mL Oral Daily PRN Tex Drilling, NP       metFORMIN  (GLUCOPHAGE ) tablet 500 mg  500 mg Oral Q breakfast Tex Drilling, NP   500 mg at 03/25/24 0814   multivitamin with minerals tablet 1 tablet  1 tablet Oral Daily Tex Drilling, NP   1 tablet at 03/25/24 0945   nicotine  (NICODERM CQ  - dosed in mg/24 hours) patch 21 mg  21 mg Transdermal Q0600 Nkwenti, Doris, NP   21 mg at 03/25/24 9366   ondansetron  (ZOFRAN -ODT) disintegrating tablet 4 mg  4 mg Oral Q6H PRN Tex Drilling, NP   4 mg at 03/24/24 1839   thiamine  (VITAMIN B1) tablet 100 mg  100 mg Oral Daily Nkwenti, Doris, NP   100 mg at 03/25/24 0945   traZODone  (DESYREL ) tablet 50 mg  50 mg Oral QHS PRN Tex Drilling, NP   50 mg at 03/24/24 2150   Current Outpatient Medications  Medication Sig Dispense Refill   albuterol  (VENTOLIN  HFA) 108 (90 Base) MCG/ACT inhaler Inhale 2 puffs into the lungs every 6 (six) hours as needed for wheezing or shortness of breath.      Clobetasol Propionate 0.05 % shampoo Apply 1 Application topically once a week.     doxepin  (SINEQUAN ) 10 MG capsule Take 10 mg by mouth at bedtime.     gabapentin  (NEURONTIN ) 600 MG tablet Take 600 mg by mouth daily.     lamoTRIgine  (LAMICTAL ) 200 MG tablet Take 400 mg by mouth at bedtime.     LORazepam  (ATIVAN ) 1 MG tablet Take 0.5-1 mg by mouth daily as needed (For anxiety or panic attacks).     Lurasidone  HCl 60 MG TABS Take 60 mg by mouth at bedtime.     metFORMIN   (GLUCOPHAGE -XR) 500 MG 24 hr tablet Take 500 mg by mouth daily.     norethindrone (MICRONOR) 0.35 MG tablet Take 1 tablet by mouth at bedtime.     Vilazodone HCl 20 MG TABS Take 20 mg by mouth at bedtime.      Labs  Lab Results:  Admission on  03/24/2024  Component Date Value Ref Range Status   Glucose-Capillary 03/24/2024 155 (H)  70 - 99 mg/dL Final   Glucose reference range applies only to samples taken after fasting for at least 8 hours.   Glucose-Capillary 03/25/2024 92  70 - 99 mg/dL Final   Glucose reference range applies only to samples taken after fasting for at least 8 hours.   Glucose-Capillary 03/25/2024 126 (H)  70 - 99 mg/dL Final   Glucose reference range applies only to samples taken after fasting for at least 8 hours.  Admission on 03/24/2024, Discharged on 03/24/2024  Component Date Value Ref Range Status   WBC 03/24/2024 13.6 (H)  4.0 - 10.5 K/uL Final   RBC 03/24/2024 4.43  3.87 - 5.11 MIL/uL Final   Hemoglobin 03/24/2024 14.4  12.0 - 15.0 g/dL Final   HCT 98/80/7973 41.9  36.0 - 46.0 % Final   MCV 03/24/2024 94.6  80.0 - 100.0 fL Final   MCH 03/24/2024 32.5  26.0 - 34.0 pg Final   MCHC 03/24/2024 34.4  30.0 - 36.0 g/dL Final   RDW 98/80/7973 14.0  11.5 - 15.5 % Final   Platelets 03/24/2024 264  150 - 400 K/uL Final   nRBC 03/24/2024 0.0  0.0 - 0.2 % Final   Neutrophils Relative % 03/24/2024 83  % Final   Neutro Abs 03/24/2024 11.4 (H)  1.7 - 7.7 K/uL Final   Lymphocytes Relative 03/24/2024 11  % Final   Lymphs Abs 03/24/2024 1.5  0.7 - 4.0 K/uL Final   Monocytes Relative 03/24/2024 4  % Final   Monocytes Absolute 03/24/2024 0.5  0.1 - 1.0 K/uL Final   Eosinophils Relative 03/24/2024 0  % Final   Eosinophils Absolute 03/24/2024 0.0  0.0 - 0.5 K/uL Final   Basophils Relative 03/24/2024 1  % Final   Basophils Absolute 03/24/2024 0.1  0.0 - 0.1 K/uL Final   Immature Granulocytes 03/24/2024 1  % Final   Abs Immature Granulocytes 03/24/2024 0.08 (H)  0.00 - 0.07  K/uL Final   Performed at [facility], [address], [place], [place] [address]   Sodium 03/24/2024 142  135 - 145 mmol/L Final   Potassium 03/24/2024 4.4  3.5 - 5.1 mmol/L Final   Chloride 03/24/2024 99  98 - 111 mmol/L Final   CO2 03/24/2024 24  22 - 32 mmol/L Final   Glucose, Bld 03/24/2024 97  70 - 99 mg/dL Final   Glucose reference range applies only to samples taken after fasting for at least 8 hours.   BUN 03/24/2024 8  6 - 20 mg/dL Final   Creatinine, Ser 03/24/2024 0.63  0.44 - 1.00 mg/dL Final   Calcium  03/24/2024 9.5  8.9 - 10.3 mg/dL Final   Total Protein 98/80/7973 7.4  6.5 - 8.1 g/dL Final   Albumin 98/80/7973 4.9  3.5 - 5.0 g/dL Final   AST 98/80/7973 34  15 - 41 U/L Final   ALT 03/24/2024 26  0 - 44 U/L Final   Alkaline Phosphatase 03/24/2024 64  38 - 126 U/L Final   Total Bilirubin 03/24/2024 0.7  0.0 - 1.2 mg/dL Final   GFR, Estimated 03/24/2024 >60  >60 mL/min Final   Comment: (NOTE) Calculated using the CKD-EPI Creatinine Equation (2021)    Anion gap 03/24/2024 20 (H)  5 - 15 Final   Performed at [facility] Lab, [address], [place], [place] [address]   Hgb A1c MFr Bld 03/24/2024 5.4  4.8 -  5.6 % Final   Comment: (NOTE) Diagnosis of Diabetes The following HbA1c ranges recommended by the American Diabetes Association (ADA) may be used as an aid in the diagnosis of diabetes mellitus.  Hemoglobin             Suggested A1C NGSP%              Diagnosis  <5.7                   Non Diabetic  5.7-6.4                Pre-Diabetic  >6.4                   Diabetic  <7.0                   Glycemic control for                       adults with diabetes.     Mean Plasma Glucose 03/24/2024 108.28  mg/dL Final   Performed at [facility], [address] [place], [place] [address]   Magnesium  03/24/2024 1.8  1.7 - 2.4 mg/dL Final   Performed at [facility], [address], [place], [place] [address]   Alcohol, Ethyl (B) 03/24/2024 58 (H)   <15 mg/dL Final   Comment: (NOTE) For medical purposes only. Performed at [facility], [address], [place], [place] [address]    Cholesterol 03/24/2024 190  0 - 200 mg/dL Final   Comment:        ATP III CLASSIFICATION:  <200     mg/dL   Desirable  799-760  mg/dL   Borderline High  >=759    mg/dL   High           Triglycerides 03/24/2024 45  <150 mg/dL Final   HDL 98/80/7973 >135  >40 mg/dL Final   Total CHOL/HDL Ratio 03/24/2024 NOT CALCULATED  RATIO Final   VLDL 03/24/2024 9  0 - 40 mg/dL Final   LDL Cholesterol 03/24/2024 NOT CALCULATED  0 - 99 mg/dL Final   Comment:        Total Cholesterol/HDL:CHD Risk Coronary Heart Disease Risk Table                     Men   Women  1/2 Average Risk   3.4   3.3  Average Risk       5.0   4.4  2 X Average Risk   9.6   7.1  3 X Average Risk  23.4   11.0        Use the calculated Patient Ratio above and the CHD Risk Table to determine the patient's CHD Risk.        ATP III CLASSIFICATION (LDL):  <100     mg/dL   Optimal  899-870  mg/dL   Near or Above                    Optimal  130-159  mg/dL   Borderline  839-810  mg/dL   High  >809     mg/dL   Very High Performed at [facility] Lab, [address], [place], [place] [address]    TSH 03/24/2024 0.299 (L)  0.350 - 4.500 uIU/mL Final   Performed at [facility], [address], [place], [place] [address]  Color, Urine 03/24/2024 YELLOW  YELLOW Final   APPearance 03/24/2024 HAZY (A)  CLEAR Final   Specific Gravity, Urine 03/24/2024 1.024  1.005 - 1.030 Final   pH 03/24/2024 5.0  5.0 - 8.0 Final   Glucose, UA 03/24/2024 NEGATIVE  NEGATIVE mg/dL Final   Hgb urine dipstick 03/24/2024 SMALL (A)  NEGATIVE Final   Bilirubin Urine 03/24/2024 NEGATIVE  NEGATIVE Final   Ketones, ur 03/24/2024 20 (A)  NEGATIVE mg/dL Final   Protein, ur 98/80/7973 30 (A)  NEGATIVE mg/dL Final   Nitrite 98/80/7973 NEGATIVE  NEGATIVE Final   Leukocytes,Ua 03/24/2024 NEGATIVE  NEGATIVE  Final   RBC / HPF 03/24/2024 0-5  0 - 5 RBC/hpf Final   WBC, UA 03/24/2024 0-5  0 - 5 WBC/hpf Final   Bacteria, UA 03/24/2024 RARE (A)  NONE SEEN Final   Squamous Epithelial / HPF 03/24/2024 6-10  0 - 5 /HPF Final   Mucus 03/24/2024 PRESENT   Final   Performed at [facility] Lab, [address], [place], [place] [address]   Preg Test, Ur 03/24/2024 Negative  Negative Final   POC Amphetamine UR 03/24/2024 Positive (A)  NONE DETECTED (Cut Off Level 1000 ng/mL) Final   POC Secobarbital (BAR) 03/24/2024 None Detected  NONE DETECTED (Cut Off Level 300 ng/mL) Final   POC Buprenorphine (BUP) 03/24/2024 None Detected  NONE DETECTED (Cut Off Level 10 ng/mL) Final   POC Oxazepam (BZO) 03/24/2024 Positive (A)  NONE DETECTED (Cut Off Level 300 ng/mL) Final   POC Cocaine UR 03/24/2024 None Detected  NONE DETECTED (Cut Off Level 300 ng/mL) Final   POC Methamphetamine UR 03/24/2024 None Detected  NONE DETECTED (Cut Off Level 1000 ng/mL) Final   POC Morphine 03/24/2024 None Detected  NONE DETECTED (Cut Off Level 300 ng/mL) Final   POC Methadone UR 03/24/2024 None Detected  NONE DETECTED (Cut Off Level 300 ng/mL) Final   POC Oxycodone UR 03/24/2024 None Detected  NONE DETECTED (Cut Off Level 100 ng/mL) Final   POC Marijuana UR 03/24/2024 Positive (A)  NONE DETECTED (Cut Off Level 50 ng/mL) Final   Vit D, 25-Hydroxy 03/24/2024 38.6  30 - 100 ng/mL Final   Comment: (NOTE) Vitamin D  deficiency has been defined by the Institute of Medicine  and an Endocrine Society practice guideline as a level of serum 25-OH  vitamin D  less than 20 ng/mL (1,2). The Endocrine Society went on to  further define vitamin D  insufficiency as a level between 21 and 29  ng/mL (2).  1. IOM (Institute of Medicine). 2010. Dietary reference intakes for  calcium  and D. [place]: [employer]. 2. Holick MF, Binkley [place], Bischoff-Ferrari HA, et al. Evaluation,  treatment, and prevention of vitamin D  deficiency: an  Endocrine  Society clinical practice guideline, JCEM. 2011 Jul; 96(7): 1911-30.  Performed at [facility], [address] [place], [place] [address]    Vitamin B-12 03/24/2024 1,047 (H)  180 - 914 pg/mL Final   Performed at [facility], [address] [place], [place] [address]   Glucose-Capillary 03/24/2024 103 (H)  70 - 99 mg/dL Final   Glucose reference range applies only to samples taken after fasting for at least 8 hours.   Preg Test, Ur 03/24/2024 NEGATIVE  NEGATIVE Final   Comment:        THE SENSITIVITY OF THIS METHODOLOGY IS >20 mIU/mL.     Blood Alcohol level:  Lab Results  Component Value Date   ETH 58 (  H) 03/24/2024   ETH 194 (H) 11/05/2019    Metabolic Disorder Labs: Lab Results  Component Value Date   HGBA1C 5.4 03/24/2024   MPG 108.28 03/24/2024   MPG 93.93 01/21/2019   No results found for: PROLACTIN Lab Results  Component Value Date   CHOL 190 03/24/2024   TRIG 45 03/24/2024   HDL >135 03/24/2024   CHOLHDL NOT CALCULATED 03/24/2024   VLDL 9 03/24/2024   LDLCALC NOT CALCULATED 03/24/2024   LDLCALC 49 01/21/2019    Therapeutic Lab Levels: No results found for: LITHIUM No results found for: VALPROATE No results found for: CBMZ  Physical Findings   AIMS    Flowsheet Row [facility]  AIMS Total Score 0   PHQ2-9    Flowsheet Row ED from 03/24/2024 in [facility] Most recent reading at 03/24/2024  6:11 PM ED from 03/24/2024 in [facility] Most recent reading at 03/24/2024  5:20 PM  PHQ-2 Total Score 4 6  PHQ-9 Total Score 16 20   Flowsheet Row ED from 03/24/2024 in [facility] Most recent reading at 03/24/2024  5:59 PM ED from 03/24/2024 in [facility] Most recent reading at 03/24/2024  5:23 PM ED from 04/26/2021 in [facility] Most recent reading at 04/26/2021  5:24 AM  C-SSRS RISK CATEGORY Moderate Risk Low Risk No Risk     Musculoskeletal  Strength & Muscle Tone: within normal limits Gait & Station: normal Patient leans: N/A  Psychiatric Specialty Exam  Presentation  General Appearance:  Appropriate for Environment; Casual  Eye Contact: Good  Speech: Clear and Coherent; Normal Rate  Speech Volume: Normal  Handedness: Right   Mood and Affect  Mood: Anxious  Affect: Appropriate; Congruent   Thought Process  Thought Processes: Coherent; Goal Directed; Linear  Descriptions of Associations:Intact  Orientation:Full (Time, Place and Person)  Thought Content:Logical; WDL  Diagnosis of Schizophrenia or Schizoaffective disorder in past: No    Hallucinations:Hallucinations: None  Ideas of Reference:None  Suicidal Thoughts:Suicidal Thoughts: No SI Active Intent and/or Plan: With Plan  Homicidal Thoughts:Homicidal Thoughts: No   Sensorium  Memory: Immediate Good; Recent Fair  Judgment: Fair  Insight: Fair   Executive Functions  Concentration: Good  Attention Span: Good  Recall: Good  Fund of Knowledge: Good  Language: Good   Psychomotor Activity  Psychomotor Activity: Psychomotor Activity: Normal   Assets  Assets: Communication Skills; Desire for Improvement; Financial Resources/Insurance; Housing; Physical Health; Resilience; Social Support   Sleep  Sleep: Sleep: Fair  Estimated Sleeping Duration (Last 24 Hours): 12.75-13.75 hours  Nutritional Assessment (For OBS and [facility] admissions only) Has the patient had a weight loss or gain of 10 pounds or more in the last 3 months?: No Has the patient had a decrease in food intake/or appetite?: No Does the patient have dental problems?: No Does the patient have eating habits or behaviors that may be indicators of an eating disorder including binging or inducing vomiting?:  No Has the patient recently lost weight without trying?: 0 Has the patient been eating poorly because of a decreased appetite?: 0 Malnutrition Screening Tool Score: 0    Physical Exam  Physical Exam Vitals and nursing note reviewed.  Constitutional:      Appearance: Normal appearance. She is well-developed.  HENT:     Head: Normocephalic and atraumatic.     Nose: Nose  normal.  Cardiovascular:     Rate and Rhythm: Normal rate.  Pulmonary:     Effort: Pulmonary effort is normal.  Musculoskeletal:        General: Normal range of motion.     Cervical back: Normal range of motion.  Skin:    General: Skin is warm and dry.  Neurological:     Mental Status: She is alert and oriented to person, place, and time.  Psychiatric:        Attention and Perception: Attention and perception normal.        Mood and Affect: Affect normal. Mood is anxious.        Speech: Speech normal.        Behavior: Behavior normal. Behavior is cooperative.        Thought Content: Thought content normal.        Cognition and Memory: Cognition and memory normal.    Review of Systems  Constitutional: Negative.   HENT: Negative.    Eyes: Negative.   Respiratory: Negative.    Cardiovascular: Negative.   Gastrointestinal: Negative.   Genitourinary: Negative.   Musculoskeletal: Negative.   Skin: Negative.   Neurological: Negative.   Psychiatric/Behavioral:  Positive for substance abuse. The patient is nervous/anxious.    Blood pressure 122/78, pulse 94, temperature 97.8 F (36.6 C), temperature source Oral, resp. rate 20, SpO2 99%. There is no height or weight on file to calculate BMI.  Treatment Plan Summary: Daily contact with patient to assess and evaluate symptoms and progress in treatment  Labs Reviewed: No new lab orders placed. EKG from 03/24/2024 (baseline) with Sinus Tach 423 Qtc.  03/24/2024: TSH decreased: 0.299 BAL:58. UDS:+AMPH, +BZO, +THC  Medication management: -Discontinue  lorazepam  taper.  Continue: Current medications: -Acetaminophen  650 mg every 6 hours, as needed/mild pain -Maalox 30 mL oral every 4 hours, as needed/digestion -Hydroxyzine  25 mg every 6 hours as needed/anxiety or CIWA greater than 10 -Magnesium  hydroxide 30 mL daily as needed/mild constipation -Metformin  500 mg daily with breakfast -Trazodone  50 mg nightly, as needed/sleep -NicoDerm 21 mg transdermal patch daily/nicotine  withdrawal  CIWA Ativan  protocol continues: -Loperamide  2 to 4 mg oral as needed/diarrhea or loose stools -Lorazepam  1 mg every 6 hours as needed CIWA greater than 10 -Multivitamin with minerals 1 tablet daily -Ondansetron  disintegrating tablet 4 mg every 6 as needed/nausea or vomiting -Thiamine  tablet 100 mg daily   Agitation protocol continues   Discharge Planning: Sharonda would prefer to follow up with AA and attempt to attend 90 meetings in 90 days.  Disposition/case management to assist with discharge planning and identification of follow-up needs including residential or outpatient substance use treatment options as needed, prior to discharge Discharge Concerns: Need to confirm safety plan; Medication compliance and effectiveness Discharge Goals: Return home with outpatient referrals for mental health follow-up including medication management/psychotherapy and substance use treatment follow-up resources as appropriate  Note: This document was prepared using Dragon voice recognition software and may include unintentional dictation errors.  Ellouise LITTIE Dawn, FNP 03/25/2024 3:23 PM

## 2024-03-25 NOTE — Group Note (Signed)
 Group Topic: Communication  Group Date: 03/25/2024 Start Time: 1705 End Time: 1730 Facilitators: Herold Lajuana NOVAK, RN  Department: [facility]  Number of Participants: 6  Group Focus: coping skills Treatment Modality:  Individual Therapy Interventions utilized were leisure development Purpose: increase insight  Name: Alison Koch Date of Birth: 08-25-1985  MR: [id number]    Level of Participation: active Quality of Participation: cooperative Interactions with others: gave feedback Mood/Affect: appropriate Triggers (if applicable): dysfunctional relationship with live-in boyfriend Cognition: coherent/clear Progress: Gaining insight Response: I need to go home and fix my relationship. He is great support for me Plan: patient will be encouraged to continue tx after discharge  Patients Problems:  Patient Active Problem List   Diagnosis Date Noted   Bipolar 2 disorder, major depressive episode (HCC) 03/24/2024   Severe recurrent major depression with psychotic features (HCC) 01/20/2019   Alcohol dependence with alcohol-induced mood disorder (HCC)

## 2024-03-25 NOTE — ED Notes (Signed)
 Pt is resting quietly , observed turning and repositioning.  No apprent distress.  Safety maintained.

## 2024-03-25 NOTE — ED Notes (Signed)
 Patient is agitated wanting her home medications. Orders have been placed for the medications, called pharmacy and writer will have to override medications in pyxis as it is downtime

## 2024-03-25 NOTE — Care Management (Signed)
 [facility] Care Management...  Writer met with the client to discuss updates.  Client reports she receives medication Management at [facility].  Client reports she has an appointment on 04/01/24 at 1:30pm.  Client gave the Writer verbal consent to contact her provider Saddie Ha.  Writer called [facility] to confirm her appointment.  They report her appointment is on 04/01/24 at 1:30pm.  Writer informed the [facility] The client will contact them when she's discharged.  Writer informed them she was admitted due to relapse and depression.   [facility] Report prior to her being admitted she was prescribed Latuda , Lo motrin , Vilazodaone, Gabapentin , Lorazepam , and Doxepin .  Writer informed the client's provider about her medication.    Writer googled the client's therapist Vivant Lanzas.  Writer was unable to find a therapist by that name.  Writer found another provider named: Vivette Lanzas, LCMHC.  Writer was unable to find a number.  Writer will confirm with client if this is who she's seeing.

## 2024-03-25 NOTE — ED Notes (Signed)
 Patient denies pain and is resting comfortably.

## 2024-03-25 NOTE — Care Management (Addendum)
 [facility]BC Care Management...  UPDATE: Alison Koch  Client is requesting to discharge home tomorrow, 03/26/24.  Client reports she's scheduled to open at her [employer] job on Thursday morning.  Client is requesting to discharge home. Client will follow up with [facility]  for medication management and Villete Lansaz for outpatient therapy.    Client has an appointment scheduled for: 04/01/24 at 1:30pm.   RN will arrange transportation.    Client's home address:  [address]  [place] [id number]

## 2024-03-25 NOTE — ED Notes (Signed)
 Patient resting in bed with eyes closed, respirations even and unlabored,no distress noted. Will continue to monitor for safety

## 2024-03-25 NOTE — Care Management (Signed)
 [facility] Care Management...  Writer met with patient to discuss discharge planning.  Patient reported relapsing on alcohol recently and binge drinking. Patient reported presenting to [facility] due to SI. Patient reported having 1.5 years of sobriety before this incident. 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 Patient reported living with boyfriend Zack. Patient requested writer to reach out to boyfriend, once patient gets number from mother. Patient reported no current charges or court dates. Patient reported attending [facility] virtual AA meeting. Patient reported receiving medication management with Saddie Ha, has appointment scheduled. Patient reported receiving therapy with Vivant Lanzas and believes she has an appointment scheduled for next week.  Patient would like to discharge Wednesday 03/26/24, to her home reported she can provide her own transportation.  Patient will resume AA meetings, do 90/90 and connect with a sponsor

## 2024-03-25 NOTE — ED Notes (Signed)
 Pt sleeping in no acute distress. RR even and unlabored. Environment secured. Will continue to monitor for safety.

## 2024-03-26 DIAGNOSIS — F3181 Bipolar II disorder: Secondary | ICD-10-CM | POA: Diagnosis not present

## 2024-03-26 DIAGNOSIS — F1090 Alcohol use, unspecified, uncomplicated: Secondary | ICD-10-CM

## 2024-03-26 MED ORDER — NICOTINE 21 MG/24HR TD PT24: 21.0000 mg | MEDICATED_PATCH | Freq: Every day | TRANSDERMAL | 0 refills | Status: AC

## 2024-03-26 MED ORDER — VITAMIN B-1 100 MG PO TABS: 100.0000 mg | ORAL_TABLET | Freq: Every day | ORAL | 0 refills | Status: AC

## 2024-03-26 MED ORDER — ADULT MULTIVITAMIN W/MINERALS CH: 1.0000 | ORAL_TABLET | Freq: Every day | ORAL | 0 refills | Status: AC

## 2024-03-26 NOTE — ED Provider Notes (Signed)
 [facility]/OBS ASAP Discharge Summary  Date and Time: 03/26/2024 9:41 AM  Name: Alison Koch  MRN:  [id number]   Discharge Diagnoses:  Final diagnoses:  Bipolar 2 disorder (HCC)  Alcohol use disorder    Subjective: Patient states I am ready to go home.  I am feeling fine and I would like to go back to work.  Stay Summary: Alison Koch is a 39 year old female, psychiatric history including MDD, GAD and bipolar 2 disorder, who presented voluntarily to [facility] on 03/24/2024.  Upon arrival patient endorsed worsening depression and suicidal ideation after relapsing on alcohol.  Patient admitted to facility based crisis unit for treatment and stabilization on 03/24/2024.  Patient report relapsed on alcohol after 2 years to stay in sobriety approximately 4 days ago.  Patient considered residential substance use treatment and intensive outpatient treatment options.  Ultimately patient will resume AA meetings and consider sponsorship.  Patient denies symptoms of alcohol withdrawal today.  Chart reviewed and patient discussed with attending psychiatrist, Dr. Lawrnce, on 03/26/2024.  Patient reassessed by this nurse practitioner face-to-face.  Patient is seated, no apparent distress.  She is alert and oriented, pleasant and cooperative during assessment.  Patient presents with euthymic mood, congruent affect.  Alison Koch denies SI/HI/AVH.  There is no evidence of delusional thought content and no indication that patient is responding to internal stimuli.  Patient is followed by outpatient psychiatry at [facility].  Meets with individual counseling provider every other week, will consider increasing frequency of counseling meetings to weekly.  She endorses history of inpatient psychiatric hospitalization.  Most recent inpatient psychiatric hospitalization 01/20/2019 [facility].  Patient offered support and encouragement.  She declines any person to contact for  collateral information at this time.   Patient educated and verbalizes understanding of mental health resources and other crisis services in the community. They are instructed to call 911 and present to the nearest emergency room should patient experience any suicidal/homicidal ideation, auditory/visual/hallucinations, or detrimental worsening of mental health condition.    Suicide Risk Assessment:  Suicidal ideation/thoughts:   []  Current         [x]  Recent         []  Denies        []  Remote   []  Chronic  Intention to act or plan:        []  Current     []  Recent []  Denies  [x]  Remote  Preparatory behavior:  []  Recent    [x]  Denies Recent      []  Remote Instance  Suicide attempts:  []  Immediately prior to this admission     []  During this admission    []  Recent    []  Multiple   [x]  Remote    []  Denies Ever     Risk Factors  Protective Factors  Acute  None AcuteSuicideProtectiveFactors: No access to highly lethal means, Denies current SI or Intent, No recent suicide attempts, No recent self-harm behavior, No significant recent loss (job, relationship, family member), Not in acute distress, No signs of apparent anger/rage, Future oriented, Anxiety/panic systems are not acute, Actively engaged in community, No recent severe insomnia, No worsening medical conditions, No severe pain, and No legal problems  Chronic Previous suicide attempt and Major psychiatric disorder Emotional stability, Good coping or problem solving skills, No significant history of abuse/trauma, No history of violence, Employed, Stable housing, Positive social support, Lives with someone else, especially a relative, Good physical health, Age (40-59), No barriers to healthcare access,  Willingness to seek help, Medication adherence, Engagement in therapy, and Achievable life goals/ambitions   Potential future factors that may impact risk: FutureSuicideFactors : None  Summary: While it is impossible to accurately predict with  absolute certainty future events and human behaviors, an assessment of current suicidal indicators, risk factors, and protective factors suggests that this patient's:   Acute suicide risk is: minimal in degree.    Chronic suicide risk is: minimal in degree.   Suicide risk increases with substance/alcohol use and acute intoxication.      Total Time spent with patient: 30 minutes  Past Psychiatric History: MDD, GAD, bipolar 2 disorder, alcohol dependence with alcohol-induced mood disorder Past Medical History:  Past Medical History:  Diagnosis Date   Lupus (HCC)     Family History: None reported Family Psychiatric History: None reported Social History: Alison Koch resides in [place] with her significant other.  She denies access to weapons.  She is employed in armed forces training and education officer. Tobacco Cessation:  A prescription for an FDA-approved tobacco cessation medication provided at discharge  Current Medications:  Current Facility-Administered Medications  Medication Dose Route Frequency Provider Last Rate Last Admin   acetaminophen  (TYLENOL ) tablet 650 mg  650 mg Oral Q6H PRN Nkwenti, Doris, NP       alum & mag hydroxide-simeth (MAALOX/MYLANTA) 200-200-20 MG/5ML suspension 30 mL  30 mL Oral Q4H PRN Nkwenti, Doris, NP       haloperidol  (HALDOL ) tablet 5 mg  5 mg Oral TID PRN Tex Drilling, NP       And   diphenhydrAMINE  (BENADRYL ) capsule 50 mg  50 mg Oral TID PRN Tex Drilling, NP       haloperidol  lactate (HALDOL ) injection 5 mg  5 mg Intramuscular TID PRN Tex Drilling, NP       And   diphenhydrAMINE  (BENADRYL ) injection 50 mg  50 mg Intramuscular TID PRN Tex Drilling, NP       And   LORazepam  (ATIVAN ) injection 2 mg  2 mg Intramuscular TID PRN Tex Drilling, NP       haloperidol  lactate (HALDOL ) injection 10 mg  10 mg Intramuscular TID PRN Tex Drilling, NP       And   diphenhydrAMINE  (BENADRYL ) injection 50 mg  50 mg Intramuscular TID PRN Tex Drilling, NP       And   LORazepam   (ATIVAN ) injection 2 mg  2 mg Intramuscular TID PRN Tex Drilling, NP       doxepin  (SINEQUAN ) capsule 10 mg  10 mg Oral QHS Lynnette Barter, MD   10 mg at 03/25/24 2240   hydrOXYzine  (ATARAX ) tablet 25 mg  25 mg Oral Q6H PRN Tex Drilling, NP   25 mg at 03/26/24 9094   lamoTRIgine  (LAMICTAL ) tablet 400 mg  400 mg Oral QHS Ji, Andrew, MD   400 mg at 03/25/24 2138   loperamide  (IMODIUM ) capsule 2-4 mg  2-4 mg Oral PRN Nkwenti, Doris, NP       LORazepam  (ATIVAN ) tablet 1 mg  1 mg Oral Q6H PRN Tex Drilling, NP   1 mg at 03/25/24 9364   Lurasidone  HCl TABS 60 mg  60 mg Oral QHS Ji, Andrew, MD   60 mg at 03/25/24 2242   magnesium  hydroxide (MILK OF MAGNESIA) suspension 30 mL  30 mL Oral Daily PRN Tex Drilling, NP       metFORMIN  (GLUCOPHAGE ) tablet 500 mg  500 mg Oral Q breakfast Nkwenti, Doris, NP   500 mg at 03/26/24 0900   multivitamin with  minerals tablet 1 tablet  1 tablet Oral Daily Tex Drilling, NP   1 tablet at 03/26/24 9094   nicotine  (NICODERM CQ  - dosed in mg/24 hours) patch 21 mg  21 mg Transdermal Q0600 Nkwenti, Doris, NP   21 mg at 03/25/24 9366   ondansetron  (ZOFRAN -ODT) disintegrating tablet 4 mg  4 mg Oral Q6H PRN Tex Drilling, NP   4 mg at 03/24/24 1839   thiamine  (VITAMIN B1) tablet 100 mg  100 mg Oral Daily Nkwenti, Doris, NP   100 mg at 03/26/24 9094   Current Outpatient Medications  Medication Sig Dispense Refill   albuterol  (VENTOLIN  HFA) 108 (90 Base) MCG/ACT inhaler Inhale 2 puffs into the lungs every 6 (six) hours as needed for wheezing or shortness of breath.      Clobetasol Propionate 0.05 % shampoo Apply 1 Application topically once a week.     doxepin  (SINEQUAN ) 10 MG capsule Take 10 mg by mouth at bedtime.     gabapentin  (NEURONTIN ) 600 MG tablet Take 600 mg by mouth daily.     lamoTRIgine  (LAMICTAL ) 200 MG tablet Take 400 mg by mouth at bedtime.     LORazepam  (ATIVAN ) 1 MG tablet Take 0.5-1 mg by mouth daily as needed (For anxiety or panic attacks).      Lurasidone  HCl 60 MG TABS Take 60 mg by mouth at bedtime.     metFORMIN  (GLUCOPHAGE -XR) 500 MG 24 hr tablet Take 500 mg by mouth daily.     norethindrone (MICRONOR) 0.35 MG tablet Take 1 tablet by mouth at bedtime.     Vilazodone HCl 20 MG TABS Take 20 mg by mouth at bedtime.      PTA Medications:  Facility Ordered Medications  Medication   acetaminophen  (TYLENOL ) tablet 650 mg   alum & mag hydroxide-simeth (MAALOX/MYLANTA) 200-200-20 MG/5ML suspension 30 mL   magnesium  hydroxide (MILK OF MAGNESIA) suspension 30 mL   haloperidol  (HALDOL ) tablet 5 mg   And   diphenhydrAMINE  (BENADRYL ) capsule 50 mg   haloperidol  lactate (HALDOL ) injection 5 mg   And   diphenhydrAMINE  (BENADRYL ) injection 50 mg   And   LORazepam  (ATIVAN ) injection 2 mg   haloperidol  lactate (HALDOL ) injection 10 mg   And   diphenhydrAMINE  (BENADRYL ) injection 50 mg   And   LORazepam  (ATIVAN ) injection 2 mg   nicotine  (NICODERM CQ  - dosed in mg/24 hours) patch 21 mg   [COMPLETED] thiamine  (VITAMIN B1) injection 100 mg   thiamine  (VITAMIN B1) tablet 100 mg   multivitamin with minerals tablet 1 tablet   LORazepam  (ATIVAN ) tablet 1 mg   hydrOXYzine  (ATARAX ) tablet 25 mg   loperamide  (IMODIUM ) capsule 2-4 mg   ondansetron  (ZOFRAN -ODT) disintegrating tablet 4 mg   metFORMIN  (GLUCOPHAGE ) tablet 500 mg   lamoTRIgine  (LAMICTAL ) tablet 400 mg   Lurasidone  HCl TABS 60 mg   doxepin  (SINEQUAN ) capsule 10 mg   PTA Medications  Medication Sig   albuterol  (VENTOLIN  HFA) 108 (90 Base) MCG/ACT inhaler Inhale 2 puffs into the lungs every 6 (six) hours as needed for wheezing or shortness of breath.    Clobetasol Propionate 0.05 % shampoo Apply 1 Application topically once a week.   doxepin  (SINEQUAN ) 10 MG capsule Take 10 mg by mouth at bedtime.   Lurasidone  HCl 60 MG TABS Take 60 mg by mouth at bedtime.   Vilazodone HCl 20 MG TABS Take 20 mg by mouth at bedtime.   LORazepam  (ATIVAN ) 1 MG tablet Take 0.5-1 mg by mouth daily as  needed (For anxiety or panic attacks).   metFORMIN  (GLUCOPHAGE -XR) 500 MG 24 hr tablet Take 500 mg by mouth daily.   norethindrone (MICRONOR) 0.35 MG tablet Take 1 tablet by mouth at bedtime.   gabapentin  (NEURONTIN ) 600 MG tablet Take 600 mg by mouth daily.   lamoTRIgine  (LAMICTAL ) 200 MG tablet Take 400 mg by mouth at bedtime.       03/24/2024    6:11 PM 03/24/2024    5:20 PM  Depression screen PHQ 2/9  Decreased Interest 2 3  Down, Depressed, Hopeless 2 3  PHQ - 2 Score 4 6  Altered sleeping 2 2  Tired, decreased energy 1 2  Change in appetite 1 2  Feeling bad or failure about yourself  2 2  Trouble concentrating 2 2  Moving slowly or fidgety/restless 2 2  Suicidal thoughts 2 2  PHQ-9 Score 16 20  Difficult doing work/chores Somewhat difficult Very difficult    Flowsheet Row ED from 03/24/2024 in [facility] Most recent reading at 03/24/2024  5:59 PM ED from 03/24/2024 in [facility] Most recent reading at 03/24/2024  5:23 PM ED from 04/26/2021 in [facility] Emergency Department at [facility] Most recent reading at 04/26/2021  5:24 AM  C-SSRS RISK CATEGORY Moderate Risk Low Risk No Risk    Musculoskeletal  Strength & Muscle Tone: within normal limits Gait & Station: normal Patient leans: N/A  Psychiatric Specialty Exam  Presentation  General Appearance:  Appropriate for Environment; Casual  Eye Contact: Good  Speech: Clear and Coherent; Normal Rate  Speech Volume: Normal  Handedness: Right   Mood and Affect  Mood: Euthymic  Affect: Appropriate; Congruent   Thought Process  Thought Processes: Coherent; Goal Directed; Linear  Descriptions of Associations:Intact  Orientation:Full (Time, Place and Person)  Thought Content:Logical; WDL  Diagnosis of Schizophrenia or Schizoaffective disorder in past: No    Hallucinations:Hallucinations: None  Ideas of Reference:None  Suicidal  Thoughts:Suicidal Thoughts: No  Homicidal Thoughts:Homicidal Thoughts: No   Sensorium  Memory: Immediate Good; Recent Good  Judgment: Good  Insight: Fair   Art Therapist  Concentration: Good  Attention Span: Good  Recall: Good  Fund of Knowledge: Good  Language: Good   Psychomotor Activity  Psychomotor Activity: Psychomotor Activity: Normal   Assets  Assets: Communication Skills; Desire for Improvement; Financial Resources/Insurance; Housing; Leisure Time; Physical Health; Resilience; Social Support; Vocational/Educational   Sleep  Sleep: Sleep: Good  Estimated Sleeping Duration (Last 24 Hours): 13.50-14.75 hours  No data recorded  Physical Exam  Physical Exam Vitals and nursing note reviewed.  Constitutional:      Appearance: Normal appearance. She is well-developed.  HENT:     Head: Normocephalic and atraumatic.     Nose: Nose normal.  Cardiovascular:     Rate and Rhythm: Normal rate.  Pulmonary:     Effort: Pulmonary effort is normal.  Musculoskeletal:        General: Normal range of motion.     Cervical back: Normal range of motion.  Skin:    General: Skin is warm and dry.  Neurological:     Mental Status: She is alert and oriented to person, place, and time.  Psychiatric:        Attention and Perception: Attention and perception normal.        Mood and Affect: Mood and affect normal.        Speech: Speech normal.        Behavior: Behavior  normal. Behavior is cooperative.        Thought Content: Thought content normal.        Cognition and Memory: Cognition and memory normal.        Judgment: Judgment normal.    Review of Systems  Constitutional: Negative.   HENT: Negative.    Eyes: Negative.   Respiratory: Negative.    Cardiovascular: Negative.   Gastrointestinal: Negative.   Genitourinary: Negative.   Musculoskeletal: Negative.   Skin: Negative.   Neurological: Negative.   Psychiatric/Behavioral:  Positive for  substance abuse.    Blood pressure 118/66, pulse 87, temperature 98.7 F (37.1 C), temperature source Oral, resp. rate 17, SpO2 99%. There is no height or weight on file to calculate BMI.  Demographic Factors:  Caucasian  Loss Factors: NA  Historical Factors: Prior suicide attempts  Risk Reduction Factors:   Sense of responsibility to family, Employed, Living with another person, especially a relative, Positive social support, Positive therapeutic relationship, and Positive coping skills or problem solving skills  Continued Clinical Symptoms:  Alcohol/Substance Abuse/Dependencies Previous Psychiatric Diagnoses and Treatments  Cognitive Features That Contribute To Risk:  None    Suicide Risk:  Minimal: No identifiable suicidal ideation.  Patients presenting with no risk factors but with morbid ruminations; may be classified as minimal risk based on the severity of the depressive symptoms  Plan Of Care/Follow-up recommendations:  The patient is able to verbalize their individual safety plan to this provider.   # It is recommended to the patient to continue psychiatric medications as prescribed, after discharge from the hospital.   Follow-up with established outpatient psychiatry provider for medication management and individual counseling, [facility].  Medication list: -Albuterol  108 mcg/ACT inhaler inhale 2 puffs into the lungs every 6 hours as needed/wheezing or shortness of breath -Clobetasol propionate 0.05% shampoo apply 1 application topically once per week -Doxepin  10 mg nightly -Gabapentin  600 mg daily -Lamotrigine  40 mg nightly -Lorazepam  0.5-1 mg by mouth daily as needed/anxiety or panic attacks -Lurasidone  60 mg nightly -Metformin  500 mg XR by mouth daily -Multivitamin with minerals 1 tablet by mouth daily -Nicotine  21 Milligram Transdermal Patch Place 1 patch to skin daily at 6 AM/nicotine  dependence -Norethindrone 0.35 mg tablet take 1 tablet by mouth  nightly -Thiamine  100 mg daily -Vilazodone 20 mg nightly   # It is recommended to the patient to follow up with your outpatient psychiatric provider and PCP.   # It was discussed with the patient, the impact of alcohol, drugs, tobacco have been there overall psychiatric and medical wellbeing, and total abstinence from substance use was recommended the patient. -Recommend abstinence from alcohol, tobacco, and other illicit drug use at discharge.    # In the event of worsening symptoms, the patient is instructed to call the crisis hotline, 911 and or go to the nearest ED for appropriate evaluation and treatment of symptoms. To follow-up with primary care provider for other medical issues, concerns and or health care needs     -If your psychiatric symptoms recur, worsen, or if you have side effects to your psychiatric medications, call your outpatient psychiatric provider, 911, 988 or go to the nearest emergency department.   -If suicidal thoughts recur, call your outpatient psychiatric provider, 911, 988 or go to the nearest emergency department.   Note: This document was prepared using Dragon voice recognition software and may include unintentional dictation errors.  Disposition: Discharge  Ellouise LITTIE Dawn, FNP 03/26/2024, 9:41 AM

## 2024-03-26 NOTE — ED Notes (Signed)
 Patient A&Ox4. Denies intent to harm self/others when asked. Denies A/VH. Patient denies any physical complaints when asked. No acute distress noted. Support and encouragement provided. Routine safety checks conducted according to facility protocol. Encouraged patient to notify staff if thoughts of harm toward self or others arise. Patient verbalize understanding and agreement. Will continue to monitor for safety.

## 2024-03-26 NOTE — ED Notes (Signed)

## 2024-03-26 NOTE — Group Note (Signed)
 Group Topic: Fears and Unhealthy Coping Skills  Group Date: 03/25/2024 Start Time: 1100 End Time: 1155 Facilitators: Gerome Jolly, NT  Department: [facility]  Number of Participants: 5  Group Focus:  Chaplin Amy helped co-facilitated the group in acceptance, healthy friendships, loss/grief issues, relapse prevention, self-esteem, and substance abuse education Treatment Modality:  Cognitive Behavioral Therapy, Psychoeducation, Solution-Focused Therapy, and Spiritual Interventions utilized were clarification, exploration, story telling, and support Purpose: explore maladaptive thinking, express feelings, improve communication skills, increase insight, regain self-worth, and relapse prevention strategies  Name: Alison Koch Date of Birth: 12/05/1985  MR: [id number]    Level of Participation: when cued Quality of Participation: attentive, cooperative, engaged, passive, and quiet Interactions with others: Patient did not communicate or share must during the discussion. However, patient did provide a response when called upon  Mood/Affect: anxious, closed / guarded, depressed, and flat Triggers (if applicable): na Cognition: blaming, fearful, and guilty Progress: Minimal Response: Patient actively listened. Patient communicated her fears of losing her boyfriend Plan: patient will be encouraged to continue therapy sessions and medication management   Patients Problems:  Patient Active Problem List   Diagnosis Date Noted   Bipolar 2 disorder, major depressive episode (HCC) 03/24/2024   Severe recurrent major depression with psychotic features (HCC) 01/20/2019   Alcohol dependence with alcohol-induced mood disorder (HCC)

## 2024-03-26 NOTE — Care Management (Signed)
 [facility] Care Management....  Patient declined inpatient and out patient treatment  Patient request to discharge today Wednesday 03/26/2024   Patient will discharge today Wednesday 03/26/24 to home..  [address]  [place] [address]   RN will arrange transportation  Scripts  Patient will follow up with providers below  Patient has appointment scheduled for 04/01/24 @ 1:30 pm  Patient receives medication management with Saddie Ha @ [facility]  Patient receives out patient therapy with Villete Lanzas

## 2024-03-26 NOTE — Group Note (Signed)
 Group Topic: Positive Affirmations  Group Date: 03/25/2024 Start Time: 2000 End Time: 2030 Facilitators: Joshua Ellouise CROME  Department: [facility]  Number of Participants: 5  Group Focus: check in Treatment Modality:  Leisure Development Interventions utilized were group exercise Purpose: reinforce self-care  Name: Daquana Paddock Date of Birth: 26-Mar-1985  MR: [id number]    Level of Participation: active Quality of Participation: engaged Interactions with others: gave feedback Mood/Affect: appropriate Triggers (if applicable):  Cognition: goal directed Progress: Gaining insight Response: Pt wants to learn how to cope without using a substance. Plan: patient will be encouraged to follow up on goals.  Patients Problems:  Patient Active Problem List   Diagnosis Date Noted   Bipolar 2 disorder, major depressive episode (HCC) 03/24/2024   Severe recurrent major depression with psychotic features (HCC) 01/20/2019   Alcohol dependence with alcohol-induced mood disorder (HCC)

## 2024-03-26 NOTE — ED Notes (Addendum)
 Patient is alert and oriented. Patient was agitated because she has not been receiving her home meds. Patient spoke with NP about the medications that she needed and he was able to prescribe them. Patient was cooperative just anxious and fixated on her meds not being accurate. Patient did not participate in group therapy today and states she is ready to go home. Patient denies SI/HI and has no other complaints. Will continue to monitor for safety

## 2024-03-26 NOTE — Discharge Instructions (Addendum)
 Patient is instructed prior to discharge to:  Take all medications as prescribed by his/her mental healthcare provider. Report any adverse effects and or reactions from the medicines to his/her outpatient provider promptly. Keep all scheduled appointments, to ensure that you are getting refills on time and to avoid any interruption in your medication.  If you are unable to keep an appointment call to reschedule.  Be sure to follow-up with resources and follow-up appointments provided.  Patient has been instructed & cautioned: To not engage in alcohol and or illegal drug use while on prescription medicines. In the event of worsening symptoms, patient is instructed to call the crisis hotline, 911 and or go to the nearest ED for appropriate evaluation and treatment of symptoms. To follow-up with his/her primary care provider for your other medical issues, concerns and or health care needs.  Information: -National Suicide Prevention Lifeline 1-800-SUICIDE or (438)377-5488.  -988 offers 24/7 access to trained crisis counselors who can help people experiencing mental health-related distress. People can call or text 988 or chat 988lifeline.org for themselves or if they are worried about a loved one who may need crisis support.        Smartphone APP [employer] chair for Alcoholics Anonymous Meeting Schedule     [facility] Care Management....  Patient declined inpatient and out patient treatment  Patient request to discharge today Wednesday 03/26/2024   Patient will discharge today Wednesday 03/26/24 to home..  [address]  [place] [address]   RN will arrange transportation  Scripts  Patient will follow up with providers below  Patient has appointment scheduled for 04/01/24 @ 1:30 pm  Patient receives medication management with Saddie Ha @ [facility]  Patient receives out patient therapy with Villete Lanzas

## 2024-03-26 NOTE — ED Notes (Signed)
 Patient is resting in bed with eyes closed, respirations even and unlabored, no distress noted, will continue to monitor
